# Patient Record
Sex: Male | Born: 1960 | Race: Black or African American | Hispanic: No | Marital: Married | Smoking: Never smoker
Health system: Southern US, Community
[De-identification: ages and names within clinical notes are randomized; demographics above are authoritative.]

## PROBLEM LIST (undated history)

## (undated) DIAGNOSIS — L309 Dermatitis, unspecified: Secondary | ICD-10-CM

## (undated) DIAGNOSIS — M1990 Unspecified osteoarthritis, unspecified site: Secondary | ICD-10-CM

## (undated) HISTORY — DX: Dermatitis, unspecified: L30.9

---

## 2004-08-03 ENCOUNTER — Emergency Department (HOSPITAL_COMMUNITY): Admission: EM | Admit: 2004-08-03 | Discharge: 2004-08-03 | Payer: Self-pay | Admitting: Family Medicine

## 2005-07-29 ENCOUNTER — Emergency Department (HOSPITAL_COMMUNITY): Admission: EM | Admit: 2005-07-29 | Discharge: 2005-07-29 | Payer: Self-pay | Admitting: Family Medicine

## 2005-10-06 ENCOUNTER — Emergency Department (HOSPITAL_COMMUNITY): Admission: EM | Admit: 2005-10-06 | Discharge: 2005-10-06 | Payer: Self-pay | Admitting: Family Medicine

## 2005-11-10 ENCOUNTER — Emergency Department (HOSPITAL_COMMUNITY): Admission: EM | Admit: 2005-11-10 | Discharge: 2005-11-10 | Payer: Self-pay | Admitting: Family Medicine

## 2007-06-14 ENCOUNTER — Emergency Department (HOSPITAL_COMMUNITY): Admission: EM | Admit: 2007-06-14 | Discharge: 2007-06-14 | Payer: Self-pay | Admitting: Emergency Medicine

## 2007-08-27 ENCOUNTER — Emergency Department (HOSPITAL_COMMUNITY): Admission: EM | Admit: 2007-08-27 | Discharge: 2007-08-27 | Payer: Self-pay | Admitting: Family Medicine

## 2007-12-01 ENCOUNTER — Ambulatory Visit: Payer: Self-pay | Admitting: Family Medicine

## 2007-12-18 ENCOUNTER — Emergency Department (HOSPITAL_COMMUNITY): Admission: EM | Admit: 2007-12-18 | Discharge: 2007-12-18 | Payer: Self-pay | Admitting: Emergency Medicine

## 2007-12-28 ENCOUNTER — Ambulatory Visit: Payer: Self-pay | Admitting: Family Medicine

## 2008-01-25 ENCOUNTER — Ambulatory Visit: Payer: Self-pay | Admitting: Family Medicine

## 2008-03-14 ENCOUNTER — Emergency Department (HOSPITAL_COMMUNITY): Admission: EM | Admit: 2008-03-14 | Discharge: 2008-03-14 | Payer: Self-pay | Admitting: Family Medicine

## 2008-05-01 ENCOUNTER — Emergency Department (HOSPITAL_COMMUNITY): Admission: EM | Admit: 2008-05-01 | Discharge: 2008-05-01 | Payer: Self-pay | Admitting: Family Medicine

## 2008-06-02 ENCOUNTER — Emergency Department (HOSPITAL_COMMUNITY): Admission: EM | Admit: 2008-06-02 | Discharge: 2008-06-02 | Payer: Self-pay | Admitting: Family Medicine

## 2010-10-09 ENCOUNTER — Emergency Department (HOSPITAL_COMMUNITY)
Admission: EM | Admit: 2010-10-09 | Discharge: 2010-10-09 | Payer: Self-pay | Source: Home / Self Care | Admitting: Emergency Medicine

## 2010-10-26 ENCOUNTER — Emergency Department (HOSPITAL_COMMUNITY)
Admission: EM | Admit: 2010-10-26 | Discharge: 2010-10-26 | Payer: Self-pay | Source: Home / Self Care | Admitting: Family Medicine

## 2011-03-30 ENCOUNTER — Emergency Department (HOSPITAL_COMMUNITY)
Admission: EM | Admit: 2011-03-30 | Discharge: 2011-03-30 | Disposition: A | Discharge status: Home / Self Care | Payer: Self-pay | Attending: Emergency Medicine | Admitting: Emergency Medicine

## 2011-03-30 ENCOUNTER — Emergency Department (HOSPITAL_COMMUNITY): Payer: Self-pay

## 2011-03-30 DIAGNOSIS — R079 Chest pain, unspecified: Secondary | ICD-10-CM | POA: Insufficient documentation

## 2011-03-30 DIAGNOSIS — I498 Other specified cardiac arrhythmias: Secondary | ICD-10-CM | POA: Insufficient documentation

## 2011-03-30 LAB — DIFFERENTIAL
Eosinophils Absolute: 0.5 10*3/uL (ref 0.0–0.7)
Lymphocytes Relative: 11 % — ABNORMAL LOW (ref 12–46)
Lymphs Abs: 1.5 10*3/uL (ref 0.7–4.0)
Monocytes Relative: 7 % (ref 3–12)
Neutrophils Relative %: 79 % — ABNORMAL HIGH (ref 43–77)

## 2011-03-30 LAB — CBC
HCT: 43 % (ref 39.0–52.0)
Hemoglobin: 15 g/dL (ref 13.0–17.0)
MCH: 28.7 pg (ref 26.0–34.0)
MCV: 82.2 fL (ref 78.0–100.0)
Platelets: 291 10*3/uL (ref 150–400)
RBC: 5.23 MIL/uL (ref 4.22–5.81)
WBC: 14.1 10*3/uL — ABNORMAL HIGH (ref 4.0–10.5)

## 2011-03-30 LAB — COMPREHENSIVE METABOLIC PANEL
Alkaline Phosphatase: 59 U/L (ref 39–117)
BUN: 13 mg/dL (ref 6–23)
CO2: 29 mEq/L (ref 19–32)
Chloride: 101 mEq/L (ref 96–112)
Creatinine, Ser: 1.05 mg/dL (ref 0.4–1.5)
GFR calc non Af Amer: >60 mL/min (ref >60)
Glucose, Bld: 97 mg/dL (ref 70–99)
Potassium: 4.8 mEq/L (ref 3.5–5.1)
Total Bilirubin: 0.6 mg/dL (ref 0.3–1.2)

## 2011-03-30 LAB — LIPASE, BLOOD: Lipase: 20 U/L (ref 11–59)

## 2011-03-30 LAB — TROPONIN I: Troponin I: <0.3 ng/mL (ref <0.30)

## 2011-06-15 ENCOUNTER — Inpatient Hospital Stay (INDEPENDENT_AMBULATORY_CARE_PROVIDER_SITE_OTHER)
Admission: RE | Admit: 2011-06-15 | Discharge: 2011-06-15 | Disposition: A | Discharge status: Home / Self Care | Payer: Self-pay | Source: Ambulatory Visit | Attending: Family Medicine | Admitting: Family Medicine

## 2011-06-15 DIAGNOSIS — B009 Herpesviral infection, unspecified: Secondary | ICD-10-CM

## 2011-06-26 ENCOUNTER — Inpatient Hospital Stay (INDEPENDENT_AMBULATORY_CARE_PROVIDER_SITE_OTHER)
Admission: RE | Admit: 2011-06-26 | Discharge: 2011-06-26 | Disposition: A | Discharge status: Home / Self Care | Payer: Self-pay | Source: Ambulatory Visit | Attending: Family Medicine | Admitting: Family Medicine

## 2011-06-26 DIAGNOSIS — L989 Disorder of the skin and subcutaneous tissue, unspecified: Secondary | ICD-10-CM

## 2011-07-25 LAB — HERPES SIMPLEX VIRUS CULTURE

## 2011-08-18 ENCOUNTER — Emergency Department (HOSPITAL_COMMUNITY)
Admission: EM | Admit: 2011-08-18 | Discharge: 2011-08-18 | Disposition: A | Discharge status: Home / Self Care | Payer: Self-pay | Attending: Emergency Medicine | Admitting: Emergency Medicine

## 2011-08-18 DIAGNOSIS — R221 Localized swelling, mass and lump, neck: Secondary | ICD-10-CM | POA: Insufficient documentation

## 2011-08-18 DIAGNOSIS — R21 Rash and other nonspecific skin eruption: Secondary | ICD-10-CM | POA: Insufficient documentation

## 2011-08-18 DIAGNOSIS — R220 Localized swelling, mass and lump, head: Secondary | ICD-10-CM | POA: Insufficient documentation

## 2011-09-15 ENCOUNTER — Ambulatory Visit (INDEPENDENT_AMBULATORY_CARE_PROVIDER_SITE_OTHER): Payer: Self-pay | Admitting: Family Medicine

## 2011-09-15 ENCOUNTER — Encounter: Payer: Self-pay | Admitting: Family Medicine

## 2011-09-15 VITALS — BP 110/72 | HR 60 | Wt 230.0 lb

## 2011-09-15 DIAGNOSIS — Z79899 Other long term (current) drug therapy: Secondary | ICD-10-CM

## 2011-09-15 DIAGNOSIS — L0390 Cellulitis, unspecified: Secondary | ICD-10-CM

## 2011-09-15 LAB — COMPREHENSIVE METABOLIC PANEL
ALT: 11 U/L (ref 0–53)
AST: 16 U/L (ref 0–37)
Albumin: 4.3 g/dL (ref 3.5–5.2)
BUN: 13 mg/dL (ref 6–23)
CO2: 26 mEq/L (ref 19–32)
Calcium: 9.5 mg/dL (ref 8.4–10.5)
Chloride: 99 mEq/L (ref 96–112)
Potassium: 4.8 mEq/L (ref 3.5–5.3)

## 2011-09-15 LAB — LIPID PANEL
HDL: 63 mg/dL (ref >39)
LDL Cholesterol: 81 mg/dL (ref 0–99)
Triglycerides: 48 mg/dL (ref <150)
VLDL: 10 mg/dL (ref 0–40)

## 2011-09-15 LAB — TSH: TSH: 1.557 u[IU]/mL (ref 0.350–4.500)

## 2011-09-15 LAB — CBC WITH DIFFERENTIAL/PLATELET
Eosinophils Absolute: 0.2 10*3/uL (ref 0.0–0.7)
Eosinophils Relative: 3 % (ref 0–5)
Hemoglobin: 14.1 g/dL (ref 13.0–17.0)
Lymphocytes Relative: 22 % (ref 12–46)
Lymphs Abs: 1.8 10*3/uL (ref 0.7–4.0)
MCH: 27.8 pg (ref 26.0–34.0)
MCV: 83.6 fL (ref 78.0–100.0)
Monocytes Relative: 9 % (ref 3–12)
Platelets: 390 10*3/uL (ref 150–400)
RBC: 5.07 MIL/uL (ref 4.22–5.81)
WBC: 8.4 10*3/uL (ref 4.0–10.5)

## 2011-09-15 MED ORDER — DOXYCYCLINE HYCLATE 100 MG PO TABS: 100.0000 mg | ORAL_TABLET | Freq: Two times a day (BID) | ORAL | Status: DC

## 2011-09-15 NOTE — Patient Instructions (Signed)
Take all the antibiotic and if not totally back to normal ,call me. 

## 2011-09-15 NOTE — Progress Notes (Signed)
  Subjective:    Patient ID: Brandon Hawkins, male    DOB: 12-Sep-1961, 50 y.o.   MRN: [id number]  HPI He is here for evaluation of lip swelling as well as lesions present in the inguinal area and on his torso. He was seen in an emergency room and given doxycycline as well as Keflex for this. He did say that this helped. He is in between jobs and does not have insurance at the present time. He's had no fever, chills, nausea, weight change. He is concerned about diabetes.   Review of Systems     Objective:   Physical Exam Exam of his face does show erythema swelling and vesicular type lesions on the lip. Exam of the inguinal area also shows some follicular type lesions present in the pubic hair.       Assessment & Plan:  Cellulitis and folliculitis, etiology unclear Will place him on doxycycline. Instructed him on proper care of this. I will draw blood work to ensure no diabetes etc.

## 2011-09-16 LAB — RPR

## 2011-09-30 ENCOUNTER — Telehealth: Payer: Self-pay | Admitting: Family Medicine

## 2011-09-30 ENCOUNTER — Encounter: Payer: Self-pay | Admitting: Family Medicine

## 2011-09-30 MED ORDER — DOXYCYCLINE HYCLATE 100 MG PO TABS: 100.0000 mg | ORAL_TABLET | Freq: Two times a day (BID) | ORAL | Status: AC

## 2011-09-30 NOTE — Telephone Encounter (Signed)
Doxycycline reordered.

## 2011-10-17 ENCOUNTER — Telehealth: Payer: Self-pay | Admitting: Family Medicine

## 2011-10-17 MED ORDER — DOXYCYCLINE HYCLATE 100 MG PO TABS: 100.0000 mg | ORAL_TABLET | Freq: Two times a day (BID) | ORAL | Status: AC

## 2011-10-17 NOTE — Telephone Encounter (Signed)
Doxycycline called in for continued difficulty with infection

## 2011-11-05 ENCOUNTER — Telehealth: Payer: Self-pay | Admitting: Internal Medicine

## 2011-11-05 NOTE — Telephone Encounter (Signed)
Pt has appt fri

## 2011-11-05 NOTE — Telephone Encounter (Signed)
Setup for an appointment prior to any more antibiotics

## 2011-11-07 ENCOUNTER — Ambulatory Visit: Payer: Self-pay | Admitting: Family Medicine

## 2011-11-14 ENCOUNTER — Ambulatory Visit: Payer: Self-pay | Admitting: Family Medicine

## 2011-11-19 ENCOUNTER — Encounter: Payer: Self-pay | Admitting: Family Medicine

## 2011-11-19 ENCOUNTER — Ambulatory Visit (INDEPENDENT_AMBULATORY_CARE_PROVIDER_SITE_OTHER): Payer: Self-pay | Admitting: Family Medicine

## 2011-11-19 VITALS — BP 124/80 | HR 54 | Ht 74.0 in | Wt 235.0 lb

## 2011-11-19 DIAGNOSIS — L0390 Cellulitis, unspecified: Secondary | ICD-10-CM

## 2011-11-19 DIAGNOSIS — L0291 Cutaneous abscess, unspecified: Secondary | ICD-10-CM

## 2011-11-19 LAB — CBC WITH DIFFERENTIAL/PLATELET
Basophils Absolute: 0 10*3/uL (ref 0.0–0.1)
Basophils Relative: 0 % (ref 0–1)
MCHC: 33.6 g/dL (ref 30.0–36.0)
Monocytes Absolute: 0.8 10*3/uL (ref 0.1–1.0)
Neutro Abs: 5.7 10*3/uL (ref 1.7–7.7)
Neutrophils Relative %: 65 % (ref 43–77)
Platelets: 356 10*3/uL (ref 150–400)
RDW: 13.8 % (ref 11.5–15.5)
WBC: 8.8 10*3/uL (ref 4.0–10.5)

## 2011-11-19 LAB — COMPREHENSIVE METABOLIC PANEL
ALT: 14 U/L (ref 0–53)
AST: 17 U/L (ref 0–37)
Albumin: 4.3 g/dL (ref 3.5–5.2)
Alkaline Phosphatase: 54 U/L (ref 39–117)
BUN: 14 mg/dL (ref 6–23)
Potassium: 4.4 mEq/L (ref 3.5–5.3)
Sodium: 137 mEq/L (ref 135–145)

## 2011-11-19 MED ORDER — SULFAMETHOXAZOLE-TRIMETHOPRIM 800-160 MG PO TABS: 1.0000 | ORAL_TABLET | Freq: Two times a day (BID) | ORAL | Status: AC

## 2011-11-19 NOTE — Progress Notes (Signed)
  Subjective:    Patient ID: Brandon Hawkins, male    DOB: [birth year], 51 y.o.   MRN: [id number]  HPI He is here for consultation concerning continued difficulty with an infection on his face. He has been on several rounds of doxycycline and still having difficulty. It is apparently also spread to the chin and submandibular area.   Review of Systems     Objective:   Physical Exam Exam of his face does show erythema as well as pustular lesions present on his chin and submandibular area. He does have a beard present.       Assessment & Plan:  Unresolved cellulitis Cultures were taken. He will be switched to Septra.

## 2011-11-23 LAB — WOUND CULTURE: Gram Stain: NONE SEEN

## 2012-03-05 ENCOUNTER — Telehealth: Payer: Self-pay | Admitting: Family Medicine

## 2012-03-05 NOTE — Telephone Encounter (Signed)
Pt doesn't have ins and money to come in that is why he called and asked please advise

## 2012-03-05 NOTE — Telephone Encounter (Signed)
Pt made appt for tuesday

## 2012-03-05 NOTE — Telephone Encounter (Signed)
I will work with them in terms of the money and keep the cost low but I'm not comfortable calling a medication without seeing him

## 2012-03-05 NOTE — Telephone Encounter (Signed)
It looks like the last time I saw him was January. He needs to set up an appointment

## 2012-03-09 ENCOUNTER — Ambulatory Visit: Payer: Self-pay | Admitting: Family Medicine

## 2012-03-17 ENCOUNTER — Ambulatory Visit: Payer: Self-pay | Admitting: Family Medicine

## 2012-03-18 ENCOUNTER — Encounter: Payer: Self-pay | Admitting: Family Medicine

## 2012-03-18 ENCOUNTER — Ambulatory Visit (INDEPENDENT_AMBULATORY_CARE_PROVIDER_SITE_OTHER): Payer: Self-pay | Admitting: Family Medicine

## 2012-03-18 VITALS — BP 132/80 | HR 62 | Temp 98.2°F | Wt 230.0 lb

## 2012-03-18 DIAGNOSIS — L738 Other specified follicular disorders: Secondary | ICD-10-CM

## 2012-03-18 DIAGNOSIS — B350 Tinea barbae and tinea capitis: Secondary | ICD-10-CM

## 2012-03-18 MED ORDER — DOXYCYCLINE HYCLATE 100 MG PO TABS: 100.0000 mg | ORAL_TABLET | Freq: Two times a day (BID) | ORAL | Status: AC

## 2012-03-18 NOTE — Progress Notes (Signed)
  Subjective:    Patient ID: Brandon Hawkins, male    DOB: Mar 14, 1961, 51 y.o.   MRN: [id number]  HPI He is here for evaluation of recurrent difficulty with infection of the chin area. He has a previous history of difficulty with cellulitis of this area. This especially occurs when he tries to shave.   Review of Systems     Objective:   Physical Exam Brandon Hawkins is noted. He does have multiple maculopapular lesions present on the chin.       Assessment & Plan:   1. Folliculitis barbae  doxycycline (VIBRA-TABS) 100 MG tablet   he is to continue on the Vibra-Tabs for at least 3 weeks. He will call me at that time. Encouraged him to grow a beard and to avoid shaving to help prevent this in the future.

## 2012-04-19 ENCOUNTER — Telehealth: Payer: Self-pay | Admitting: Family Medicine

## 2012-04-19 MED ORDER — DOXYCYCLINE HYCLATE 100 MG PO TABS: 100.0000 mg | ORAL_TABLET | Freq: Two times a day (BID) | ORAL | Status: AC

## 2012-04-19 NOTE — Telephone Encounter (Signed)
Doxycycline called in for treatment of folliculitis barbae

## 2012-05-24 ENCOUNTER — Telehealth: Payer: Self-pay | Admitting: Medical

## 2012-05-24 MED ORDER — DOXYCYCLINE HYCLATE 100 MG PO TABS: 100.0000 mg | ORAL_TABLET | Freq: Two times a day (BID) | ORAL | Status: AC

## 2012-05-24 NOTE — Telephone Encounter (Signed)
Doxycycline called in

## 2012-06-08 ENCOUNTER — Telehealth: Payer: Self-pay | Admitting: Medical

## 2012-06-08 NOTE — Telephone Encounter (Signed)
Called pt to inform him Dr.Lalonde wants pt to have follow up appt

## 2012-06-08 NOTE — Telephone Encounter (Signed)
Pt has appt 8/8

## 2012-06-08 NOTE — Telephone Encounter (Signed)
He needs a follow-up appointment

## 2012-06-08 NOTE — Telephone Encounter (Signed)
Is this ok?

## 2012-06-08 NOTE — Telephone Encounter (Signed)
This should go to Dr. Susann Givens.   I haven't seen the patient.

## 2012-06-10 ENCOUNTER — Ambulatory Visit (INDEPENDENT_AMBULATORY_CARE_PROVIDER_SITE_OTHER): Payer: Self-pay | Admitting: Family Medicine

## 2012-06-10 VITALS — BP 120/70 | HR 60 | Wt 228.0 lb

## 2012-06-10 DIAGNOSIS — L738 Other specified follicular disorders: Secondary | ICD-10-CM

## 2012-06-10 DIAGNOSIS — L739 Follicular disorder, unspecified: Secondary | ICD-10-CM

## 2012-06-10 MED ORDER — SULFAMETHOXAZOLE-TRIMETHOPRIM 800-160 MG PO TABS: 1.0000 | ORAL_TABLET | Freq: Two times a day (BID) | ORAL | Status: AC

## 2012-06-10 NOTE — Progress Notes (Signed)
  Subjective:    Patient ID: Brandon Hawkins, male    DOB: [birth year], 51 y.o.   MRN: [id number]  HPI He is here for recheck. He does state that the doxycycline did help however when he would stop taking it, the swelling and redness on the upper lip would recur. 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 I kept him on it since he clinically was responding.   Review of Systems     Objective:   Physical Exam Exam of his upper lip does show erythema and swelling with some crusting. No other lesions are noted.      Assessment & Plan:   1. Folliculitis  sulfamethoxazole-trimethoprim (BACTRIM DS,SEPTRA DS) 800-160 MG per tablet   I will switch him to Septra which the cultures it should be a better choice. He is to call me in 2 weeks.

## 2012-11-26 ENCOUNTER — Ambulatory Visit (INDEPENDENT_AMBULATORY_CARE_PROVIDER_SITE_OTHER): Payer: Self-pay | Admitting: Medical

## 2012-11-26 ENCOUNTER — Encounter: Payer: Self-pay | Admitting: Medical

## 2012-11-26 VITALS — BP 130/90 | HR 58 | Temp 97.9°F | Resp 16 | Wt 237.0 lb

## 2012-11-26 DIAGNOSIS — L0100 Impetigo, unspecified: Secondary | ICD-10-CM

## 2012-11-26 DIAGNOSIS — L738 Other specified follicular disorders: Secondary | ICD-10-CM

## 2012-11-26 DIAGNOSIS — L678 Other hair color and hair shaft abnormalities: Secondary | ICD-10-CM

## 2012-11-26 DIAGNOSIS — L739 Follicular disorder, unspecified: Secondary | ICD-10-CM

## 2012-11-26 MED ORDER — SULFAMETHOXAZOLE-TRIMETHOPRIM 800-160 MG PO TABS: 1.0000 | ORAL_TABLET | Freq: Two times a day (BID) | ORAL | Status: DC

## 2012-11-26 MED ORDER — MUPIROCIN 2 % EX OINT: TOPICAL_OINTMENT | Freq: Three times a day (TID) | CUTANEOUS | Status: DC

## 2012-11-26 NOTE — Progress Notes (Signed)
Subjective: Here for skin rash and crusting of upper lip.  Has hx/o eczema.  Gets this lip inflammation from time to time.  Was seen here August for the same.  Has been seen here several times prior for same.   Sometimes gets similar rash on pubic area and in scalp.   ROS  No fever, chills No lymph node swelling GU negative GI negative  Objective: Gen: wd, wn, nad Skin: skin above upper lip with some pustular lesions, some serous crusted lesions, overall the skin above the upper lip is inflamed and with erythema throughout.  Lips normal.  Abdomen has rough skin mildly suggestive of eczema.  Otherwise skin unremarkable   Assessment: Encounter Diagnoses  Name Primary?  . Folliculitis Yes  . 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   Begin another course of Bactrim and mupirocin ointment topically.   Gave refill in the event of recurrence.  Regarding his eczema, use daily moisturizing lotion, particularly abdomen.  F/u if not improving.

## 2012-11-26 NOTE — Patient Instructions (Signed)
Consider trying [facility] Shampoo in the beard/scalp area once to twice weekly. Massage this in, leave on for 10 minutes then wash off.  In general, moisturize your body daily with Lubriderm or similar lotion.  I am sending an ointment and oral antibiotic to your pharmacy.  Let us know if this isn't resolving the skin issue.

## 2013-11-09 ENCOUNTER — Telehealth: Payer: Self-pay | Admitting: Medical

## 2013-11-09 NOTE — Telephone Encounter (Signed)
Pt called wanting a refill on cream rx. Pt was informed he would need a rx due to the fact its been a year since he was seen. Pt was offered and appt and stated he would call back.

## 2017-07-09 NOTE — Congregational Nurse Program (Signed)
Congregational Nurse Program Note  Date of Encounter: 06/03/2017  Past Medical History: Past Medical History:  Diagnosis Date  . Eczema     Encounter Details:   Needed assistance and information about applying for the orange card.  Gave client the application and instructions

## 2017-10-14 DIAGNOSIS — M7582 Other shoulder lesions, left shoulder: Secondary | ICD-10-CM | POA: Diagnosis not present

## 2017-10-14 DIAGNOSIS — M5412 Radiculopathy, cervical region: Secondary | ICD-10-CM | POA: Diagnosis not present

## 2017-10-14 DIAGNOSIS — M170 Bilateral primary osteoarthritis of knee: Secondary | ICD-10-CM | POA: Diagnosis not present

## 2017-11-17 DIAGNOSIS — M542 Cervicalgia: Secondary | ICD-10-CM | POA: Diagnosis not present

## 2017-11-17 DIAGNOSIS — M25512 Pain in left shoulder: Secondary | ICD-10-CM | POA: Diagnosis not present

## 2017-11-17 DIAGNOSIS — M67912 Unspecified disorder of synovium and tendon, left shoulder: Secondary | ICD-10-CM | POA: Diagnosis not present

## 2017-11-17 DIAGNOSIS — M25562 Pain in left knee: Secondary | ICD-10-CM | POA: Diagnosis not present

## 2017-11-17 DIAGNOSIS — M25561 Pain in right knee: Secondary | ICD-10-CM | POA: Diagnosis not present

## 2017-11-26 DIAGNOSIS — M1711 Unilateral primary osteoarthritis, right knee: Secondary | ICD-10-CM | POA: Diagnosis not present

## 2017-11-26 DIAGNOSIS — M25661 Stiffness of right knee, not elsewhere classified: Secondary | ICD-10-CM | POA: Diagnosis not present

## 2017-11-26 DIAGNOSIS — M1712 Unilateral primary osteoarthritis, left knee: Secondary | ICD-10-CM | POA: Diagnosis not present

## 2017-12-17 DIAGNOSIS — M25661 Stiffness of right knee, not elsewhere classified: Secondary | ICD-10-CM | POA: Diagnosis not present

## 2017-12-17 DIAGNOSIS — M1712 Unilateral primary osteoarthritis, left knee: Secondary | ICD-10-CM | POA: Diagnosis not present

## 2017-12-17 DIAGNOSIS — M1711 Unilateral primary osteoarthritis, right knee: Secondary | ICD-10-CM | POA: Diagnosis not present

## 2018-06-24 DIAGNOSIS — M1712 Unilateral primary osteoarthritis, left knee: Secondary | ICD-10-CM | POA: Diagnosis not present

## 2018-06-24 DIAGNOSIS — M25461 Effusion, right knee: Secondary | ICD-10-CM | POA: Diagnosis not present

## 2018-06-24 DIAGNOSIS — M1711 Unilateral primary osteoarthritis, right knee: Secondary | ICD-10-CM | POA: Diagnosis not present

## 2018-06-24 DIAGNOSIS — M25462 Effusion, left knee: Secondary | ICD-10-CM | POA: Diagnosis not present

## 2018-07-26 DIAGNOSIS — L03012 Cellulitis of left finger: Secondary | ICD-10-CM | POA: Diagnosis not present

## 2018-07-26 DIAGNOSIS — S6992XA Unspecified injury of left wrist, hand and finger(s), initial encounter: Secondary | ICD-10-CM | POA: Diagnosis not present

## 2018-08-11 DIAGNOSIS — L03012 Cellulitis of left finger: Secondary | ICD-10-CM | POA: Diagnosis not present

## 2018-09-02 DIAGNOSIS — M79601 Pain in right arm: Secondary | ICD-10-CM | POA: Diagnosis not present

## 2018-09-02 DIAGNOSIS — M25521 Pain in right elbow: Secondary | ICD-10-CM | POA: Diagnosis not present

## 2018-11-03 HISTORY — PX: REPLACEMENT TOTAL KNEE: SUR1224

## 2018-12-27 DIAGNOSIS — M1711 Unilateral primary osteoarthritis, right knee: Secondary | ICD-10-CM | POA: Diagnosis not present

## 2018-12-27 DIAGNOSIS — M1712 Unilateral primary osteoarthritis, left knee: Secondary | ICD-10-CM | POA: Diagnosis not present

## 2019-01-04 DIAGNOSIS — M25562 Pain in left knee: Secondary | ICD-10-CM | POA: Diagnosis not present

## 2019-01-06 DIAGNOSIS — M25562 Pain in left knee: Secondary | ICD-10-CM | POA: Diagnosis not present

## 2019-01-06 DIAGNOSIS — M25561 Pain in right knee: Secondary | ICD-10-CM | POA: Diagnosis not present

## 2019-01-25 DIAGNOSIS — Z01810 Encounter for preprocedural cardiovascular examination: Secondary | ICD-10-CM | POA: Diagnosis not present

## 2019-01-25 DIAGNOSIS — Z01818 Encounter for other preprocedural examination: Secondary | ICD-10-CM | POA: Diagnosis not present

## 2019-03-14 DIAGNOSIS — Z01810 Encounter for preprocedural cardiovascular examination: Secondary | ICD-10-CM | POA: Diagnosis not present

## 2019-03-21 ENCOUNTER — Emergency Department (HOSPITAL_COMMUNITY): Payer: BLUE CROSS/BLUE SHIELD

## 2019-03-21 ENCOUNTER — Other Ambulatory Visit: Payer: Self-pay

## 2019-03-21 ENCOUNTER — Encounter (HOSPITAL_COMMUNITY): Payer: Self-pay | Admitting: *Deleted

## 2019-03-21 ENCOUNTER — Emergency Department (HOSPITAL_COMMUNITY)
Admission: EM | Admit: 2019-03-21 | Discharge: 2019-03-22 | Disposition: A | Discharge status: Home / Self Care | Payer: BLUE CROSS/BLUE SHIELD | Attending: Emergency Medicine | Admitting: Emergency Medicine

## 2019-03-21 DIAGNOSIS — R079 Chest pain, unspecified: Secondary | ICD-10-CM

## 2019-03-21 DIAGNOSIS — R0789 Other chest pain: Secondary | ICD-10-CM | POA: Diagnosis not present

## 2019-03-21 DIAGNOSIS — R9431 Abnormal electrocardiogram [ECG] [EKG]: Secondary | ICD-10-CM | POA: Diagnosis not present

## 2019-03-21 DIAGNOSIS — R0602 Shortness of breath: Secondary | ICD-10-CM | POA: Diagnosis not present

## 2019-03-21 LAB — TROPONIN I
Troponin I: <0.03 ng/mL (ref <0.03)
Troponin I: <0.03 ng/mL (ref <0.03)

## 2019-03-21 LAB — BASIC METABOLIC PANEL
Anion gap: 12 (ref 5–15)
BUN: 13 mg/dL (ref 6–20)
CO2: 25 mmol/L (ref 22–32)
Calcium: 9.4 mg/dL (ref 8.9–10.3)
Chloride: 102 mmol/L (ref 98–111)
Creatinine, Ser: 1.29 mg/dL — ABNORMAL HIGH (ref 0.61–1.24)
GFR calc Af Amer: >60 mL/min (ref >60)
GFR calc non Af Amer: >60 mL/min (ref >60)
Glucose, Bld: 122 mg/dL — ABNORMAL HIGH (ref 70–99)
Potassium: 3.9 mmol/L (ref 3.5–5.1)
Sodium: 139 mmol/L (ref 135–145)

## 2019-03-21 LAB — CBC
HCT: 41.4 % (ref 39.0–52.0)
Hemoglobin: 13.6 g/dL (ref 13.0–17.0)
MCH: 27.1 pg (ref 26.0–34.0)
MCHC: 32.9 g/dL (ref 30.0–36.0)
MCV: 82.6 fL (ref 80.0–100.0)
Platelets: 316 10*3/uL (ref 150–400)
RBC: 5.01 MIL/uL (ref 4.22–5.81)
RDW: 13.8 % (ref 11.5–15.5)
WBC: 12.2 10*3/uL — ABNORMAL HIGH (ref 4.0–10.5)
nRBC: 0 % (ref 0.0–0.2)

## 2019-03-21 MED ORDER — SODIUM CHLORIDE 0.9% FLUSH: 3.0000 mL | Freq: Once | INTRAVENOUS | Status: DC

## 2019-03-21 NOTE — ED Triage Notes (Signed)
Pt was sitting watching a Western on TV and developed left sided chest pain that started radiating up to mid chest and stated pressure.  Radiation to the back.  Pt was given 324mg  ASA and 2 sl nitro.  Pt pain has improved and sob better. Pt is alert and talking.  Scheduled for right knee surgery next week.

## 2019-03-21 NOTE — ED Provider Notes (Signed)
[facility] EMERGENCY DEPARTMENT Provider Note   CSN: [id number] Arrival date & time: 03/21/19  1548    History   Chief Complaint Chief Complaint  Patient presents with  . Chest Pain    HPI Brandon Hawkins is a 58 y.o. male who presents with chest pain. PMH significant for arthritis.  Patient states that earlier today he ate breakfast and around 1130 when he was sitting on the couch she developed some left-sided chest pain.  He states it was a sharp pain and he had to stand up because it was so uncomfortable.  This caused the pain to ease off some and later that day when he ate lunch he started to have worsening pain again.  He denies any associated symptoms.  No fever, cough, diaphoresis, nausea, shortness of breath, leg swelling.  He has had a similar pain approximately 10 years ago but denies any prior cardiac work-up.  He does not smoke or use any drugs.  He denies any significant family history of cardiac disease.  He does not take any medicines.  EMS was called and he was given aspirin and nitroglycerin and this resolved his symptoms.     HPI  Past Medical History:  Diagnosis Date  . Eczema     There are no active problems to display for this patient.   History reviewed. No pertinent surgical history.      Home Medications    Prior to Admission medications   Medication Sig Start Date End Date Taking? Authorizing Provider  mupirocin ointment (BACTROBAN) 2 % Apply topically 3 (three) times daily. 11/26/12   Tysinger, Kermit Balo, PA-C  sulfamethoxazole-trimethoprim (BACTRIM DS,SEPTRA DS) 800-160 MG per tablet Take 1 tablet by mouth 2 (two) times daily. 11/26/12   Tysinger, Kermit Balo, PA-C    Family History No family history on file.  Social History Social History   Tobacco Use  . Smoking status: Never Smoker  . Smokeless tobacco: Never Used  Substance Use Topics  . Alcohol use: Yes  . Drug use: No     Allergies   Patient has no known allergies.    Review of Systems Review of Systems  Constitutional: Negative for fever.  Respiratory: Negative for cough, shortness of breath and wheezing.   Cardiovascular: Positive for chest pain. Negative for palpitations and leg swelling.  Gastrointestinal: Negative for abdominal pain, diarrhea, nausea and vomiting.  Genitourinary: Negative for dysuria.  All other systems reviewed and are negative.    Physical Exam Updated Vital Signs BP (!) 142/79 (BP Location: Right Arm)   Pulse 64   Temp 98.7 F (37.1 C) (Oral)   Resp 16   SpO2 100%   Physical Exam Vitals signs and nursing note reviewed.  Constitutional:      General: He is not in acute distress.    Appearance: Normal appearance. He is well-developed. He is obese. He is not ill-appearing.     Comments: Calm, cooperative, pleasant  HENT:     Head: Normocephalic and atraumatic.  Eyes:     General: No scleral icterus.       Right eye: No discharge.        Left eye: No discharge.     Conjunctiva/sclera: Conjunctivae normal.     Pupils: Pupils are equal, round, and reactive to light.  Neck:     Musculoskeletal: Normal range of motion.  Cardiovascular:     Rate and Rhythm: Normal rate and regular rhythm.  Pulmonary:  Effort: Pulmonary effort is normal. No respiratory distress.     Breath sounds: Normal breath sounds.  Abdominal:     General: There is no distension.     Palpations: Abdomen is soft.     Tenderness: There is no abdominal tenderness.  Skin:    General: Skin is warm and dry.  Neurological:     Mental Status: He is alert and oriented to person, place, and time.  Psychiatric:        Behavior: Behavior normal.      ED Treatments / Results  Labs (all labs ordered are listed, but only abnormal results are displayed) Labs Reviewed  BASIC METABOLIC PANEL - Abnormal; Notable for the following components:      Result Value   Glucose, Bld 122 (*)    Creatinine, Ser 1.29 (*)    All other components within  normal limits  CBC - Abnormal; Notable for the following components:   WBC 12.2 (*)    All other components within normal limits  TROPONIN I  TROPONIN I    EKG EKG Interpretation  Date/Time:  Monday Mar 21 2019 15:53:03 EDT Ventricular Rate:  86 PR Interval:  176 QRS Duration: 82 QT Interval:  366 QTC Calculation: 437 R Axis:   78 Text Interpretation:  Normal sinus rhythm ST & T wave abnormality, consider anterior ischemia Abnormal ECG When compared to prior, new t wave inversion in V4.  No STEMI Confirmed by Theda Belfastegeler, Chris ([id number]) on 03/21/2019 10:31:35 PM   Radiology Dg Chest 2 View  Result Date: 03/21/2019 CLINICAL DATA:  Chest pain and shortness of breath EXAM: CHEST - 2 VIEW COMPARISON:  Mar 30, 2011 FINDINGS: No edema or consolidation. Heart size and pulmonary vascularity are normal. No adenopathy. No pneumothorax. No bone lesions. IMPRESSION: No edema or consolidation. Electronically Signed   By: Bretta BangWilliam  Woodruff III M.D.   On: 03/21/2019 16:17    Procedures Procedures (including critical care time)  Medications Ordered in ED Medications  sodium chloride flush (NS) 0.9 % injection 3 mL (has no administration in time range)     Initial Impression / Assessment and Plan / ED Course  I have reviewed the triage vital signs and the nursing notes.  Pertinent labs & imaging results that were available during my care of the patient were reviewed by me and considered in my medical decision making (see chart for details).  58 year old with chest pain after eating that resolved with nitro earlier today. Chest pain work up is reassuring. EKG is NSR. CXR is negative. Initial and second troponin is 0. Labs are unremarkable. No significant past or family hx of cardiac disease. Patient is non-smoker. HEART score is 3. Pain has typical and atypical features for ACS. Suspect pain may have been related to food intake although he does have some risk factors and pain improved with nitro. He  would rather follow up with his doctor than come in for chest pain obs. He was advised to f/u with his doctor and strongly advised to see a cardiologist. He verbalized understanding.  Final Clinical Impressions(s) / ED Diagnoses   Final diagnoses:  Nonspecific chest pain    ED Discharge Orders    None       Bethel BornGekas, Darling Cieslewicz Marie, PA-C 03/22/19 0011    Tegeler, Canary Brimhristopher J, MD 03/22/19 [phone]

## 2019-03-21 NOTE — Discharge Instructions (Signed)
Please follow up with your doctor  Make an appointment with cardiology Please return for any worsening symptoms

## 2019-03-22 DIAGNOSIS — M1711 Unilateral primary osteoarthritis, right knee: Secondary | ICD-10-CM | POA: Diagnosis not present

## 2019-03-22 NOTE — ED Notes (Signed)
Patient verbalizes understanding of discharge instructions. Opportunity for questioning and answers were provided. Armband removed by staff, pt discharged from ED.  

## 2019-03-23 DIAGNOSIS — M170 Bilateral primary osteoarthritis of knee: Secondary | ICD-10-CM | POA: Diagnosis not present

## 2019-03-23 DIAGNOSIS — Z96651 Presence of right artificial knee joint: Secondary | ICD-10-CM | POA: Diagnosis not present

## 2019-03-23 DIAGNOSIS — M1711 Unilateral primary osteoarthritis, right knee: Secondary | ICD-10-CM | POA: Diagnosis not present

## 2019-04-19 DIAGNOSIS — K610 Anal abscess: Secondary | ICD-10-CM | POA: Diagnosis not present

## 2019-05-17 DIAGNOSIS — M25562 Pain in left knee: Secondary | ICD-10-CM | POA: Diagnosis not present

## 2019-05-17 DIAGNOSIS — M1712 Unilateral primary osteoarthritis, left knee: Secondary | ICD-10-CM | POA: Diagnosis not present

## 2019-05-19 DIAGNOSIS — K6289 Other specified diseases of anus and rectum: Secondary | ICD-10-CM | POA: Diagnosis not present

## 2019-05-26 ENCOUNTER — Ambulatory Visit: Payer: Self-pay | Admitting: General Surgery

## 2019-05-26 DIAGNOSIS — K603 Anal fistula: Secondary | ICD-10-CM | POA: Diagnosis not present

## 2019-05-26 NOTE — H&P (Signed)
History of Present Illness Brandon Ruff MD; 1/61/0960 11:36 AM) The patient is a 58 year old male who presents with anal pain. He presented to the office last week per request of Dr. Radene Ou, at [place] at [facility], who evaluated him on 04/19/19 for perianal pain. She drained a right posterior perianal abscess and obtain a culture which grew Escherichia coli. He was given Augmentin which was switched to Levaquin. He states over the past month the area has drained intermittently. He complains of pain after bowel movements but otherwise has not had much pain over the past month. 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 Several months before this, he had one episode of perianal drainage and an area of pain which resolved spontaneously. He states he has never had a colonoscopy. He does not believe he has diabetes. He does not smoke. He saw [place] in the office last week and was started on Augmentin for some continued purulent drainage. He states that his symptoms are much better now.   Problem List/Past Medical Brandon Ruff, MD; 4/54/0981 11:38 AM) ANAL FISTULA (K60.3)  Past Surgical History Brandon Ruff, MD; 1/91/4782 11:38 AM) Knee Surgery Right.  Allergies Mammie Lorenzo, LPN; 9/56/2130 86:57 AM) No Known Allergies [05/19/2019]: No Known Drug Allergies [05/19/2019]:  Medication History Brandon Ruff, MD; 8/46/9629 11:38 AM) oxyCODONE-Acetaminophen (2.5-325MG  Tablet, Oral) Active.  Social History Brandon Ruff, MD; 03/31/4131 11:38 AM) Alcohol use Remotely quit alcohol use. Caffeine use Coffee. Illicit drug use Remotely quit drug use. Tobacco use Former smoker.  Family History Brandon Ruff, MD; 4/40/1027 11:38 AM) Arthritis Brother, Mother, Sister. Diabetes Mellitus Brother.  Other Problems Brandon Ruff, MD; 2/53/6644 11:38 AM) Arthritis Back Pain     Review of Systems Brandon Ruff MD; 0/34/7425 11:38 AM) General Not Present-  Appetite Loss, Chills, Fatigue, Fever, Night Sweats, Weight Gain and Weight Loss. Skin Not Present- Change in Wart/Mole, Dryness, Hives, Jaundice, New Lesions, Non-Healing Wounds, Rash and Ulcer. HEENT Not Present- Earache, Hearing Loss, Hoarseness, Nose Bleed, Oral Ulcers, Ringing in the Ears, Seasonal Allergies, Sinus Pain, Sore Throat, Visual Disturbances, Wears glasses/contact lenses and Yellow Eyes. Respiratory Not Present- Bloody sputum, Chronic Cough, Difficulty Breathing, Snoring and Wheezing. Breast Not Present- Breast Mass, Breast Pain, Nipple Discharge and Skin Changes. Cardiovascular Not Present- Chest Pain, Difficulty Breathing Lying Down, Leg Cramps, Palpitations, Rapid Heart Rate, Shortness of Breath and Swelling of Extremities. Gastrointestinal Not Present- Abdominal Pain, Bloating, Bloody Stool, Change in Bowel Habits, Chronic diarrhea, Constipation, Difficulty Swallowing, Excessive gas, Gets full quickly at meals, Hemorrhoids, Indigestion, Nausea, Rectal Pain and Vomiting. Male Genitourinary Not Present- Blood in Urine, Change in Urinary Stream, Frequency, Impotence, Nocturia, Painful Urination, Urgency and Urine Leakage. Musculoskeletal Not Present- Back Pain, Joint Pain, Joint Stiffness, Muscle Pain, Muscle Weakness and Swelling of Extremities. Neurological Not Present- Decreased Memory, Fainting, Headaches, Numbness, Seizures, Tingling, Tremor, Trouble walking and Weakness. Psychiatric Not Present- Anxiety, Bipolar, Change in Sleep Pattern, Depression, Fearful and Frequent crying. Endocrine Not Present- Cold Intolerance, Excessive Hunger, Hair Changes, Heat Intolerance and New Diabetes. Hematology Not Present- Blood Thinners, Easy Bruising, Excessive bleeding, Gland problems, HIV and Persistent Infections.  Vitals Claiborne Billings Dockery LPN; 9/56/3875 64:33 AM) 05/26/2019 11:16 AM Weight: 266.8 lb Height: 74in Body Surface Area: 2.46 m Body Mass Index: 34.25 kg/m  Temp.:  98.66F(Temporal)  Pulse: 92 (Regular)  BP: 146/80 (Sitting, Left Arm, Standard)        Physical Exam Brandon Ruff MD; 2/95/1884 11:39 AM)  The physical exam findings are  as follows: Note:GENERAL: Well-developed, well nourished male in no acute distress  EYES: No scleral icterus Pupils equal, lids normal  EXTERNAL EARS: Intact, no masses or lesions EXTERNAL NOSE: Intact, no masses or lesions MOUTH: Lips - no lesions Dentition - normal for age  RESPIRATORY: Normal effort, no use of accessory muscles  MUSCULOSKELETAL: Normal gait Grossly normal ROM upper extremities Grossly normal ROM lower extremities  SKIN: Warm and dry Not diaphoretic  PSYCHIATRIC: Normal judgement and insight Normal mood and affect Alert, oriented x 3  Rectal Note: Right posterior perianal region: There is a 5 mm external opening on the skin in the right posteriolateral anal region. When pressing on the surrounding tissues a small amount of purulent drainage releases. There is no surrounding erythema, induration, fluctuance, or swelling. DRE without obvious masses. During exam, a larger amount of purulence was expelled from external opening.    Assessment & Plan (Kyomi Hector MD; 05/26/2019 11:38 AM)  ANAL FISTULA (K60.3) Impression: 58-year-old male status post drainage of perirectal abscess approximately 6 weeks ago, who has persistent purulent drainage. 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 I have recommended anal exam under anesthesia with fistulotomy versus seton placement. We discussed that his fistula does seem to be superficial and this is promising for performing a fistulotomy. We discussed the risk of incontinence with fistulotomy as well as the need for additional surgery if seton is needed. 

## 2019-05-26 NOTE — H&P (View-Only) (Signed)
History of Present Illness Brandon Ruff MD; 1/61/0960 11:36 AM) The patient is a 58 year old male who presents with anal pain. He presented to the office last week per request of Dr. Radene Ou, at [place] at [facility], who evaluated him on 04/19/19 for perianal pain. She drained a right posterior perianal abscess and obtain a culture which grew Escherichia coli. He was given Augmentin which was switched to Levaquin. He states over the past month the area has drained intermittently. He complains of pain after bowel movements but otherwise has not had much pain over the past month. 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 Several months before this, he had one episode of perianal drainage and an area of pain which resolved spontaneously. He states he has never had a colonoscopy. He does not believe he has diabetes. He does not smoke. He saw [place] in the office last week and was started on Augmentin for some continued purulent drainage. He states that his symptoms are much better now.   Problem List/Past Medical Brandon Ruff, MD; 4/54/0981 11:38 AM) ANAL FISTULA (K60.3)  Past Surgical History Brandon Ruff, MD; 1/91/4782 11:38 AM) Knee Surgery Right.  Allergies Brandon Lorenzo, LPN; 9/56/2130 86:57 AM) No Known Allergies [05/19/2019]: No Known Drug Allergies [05/19/2019]:  Medication History Brandon Ruff, MD; 8/46/9629 11:38 AM) oxyCODONE-Acetaminophen (2.5-325MG  Tablet, Oral) Active.  Social History Brandon Ruff, MD; 03/31/4131 11:38 AM) Alcohol use Remotely quit alcohol use. Caffeine use Coffee. Illicit drug use Remotely quit drug use. Tobacco use Former smoker.  Family History Brandon Ruff, MD; 4/40/1027 11:38 AM) Arthritis Brother, Mother, Sister. Diabetes Mellitus Brother.  Other Problems Brandon Ruff, MD; 2/53/6644 11:38 AM) Arthritis Back Pain     Review of Systems Brandon Ruff MD; 0/34/7425 11:38 AM) General Not Present-  Appetite Loss, Chills, Fatigue, Fever, Night Sweats, Weight Gain and Weight Loss. Skin Not Present- Change in Wart/Mole, Dryness, Hives, Jaundice, New Lesions, Non-Healing Wounds, Rash and Ulcer. HEENT Not Present- Earache, Hearing Loss, Hoarseness, Nose Bleed, Oral Ulcers, Ringing in the Ears, Seasonal Allergies, Sinus Pain, Sore Throat, Visual Disturbances, Wears glasses/contact lenses and Yellow Eyes. Respiratory Not Present- Bloody sputum, Chronic Cough, Difficulty Breathing, Snoring and Wheezing. Breast Not Present- Breast Mass, Breast Pain, Nipple Discharge and Skin Changes. Cardiovascular Not Present- Chest Pain, Difficulty Breathing Lying Down, Leg Cramps, Palpitations, Rapid Heart Rate, Shortness of Breath and Swelling of Extremities. Gastrointestinal Not Present- Abdominal Pain, Bloating, Bloody Stool, Change in Bowel Habits, Chronic diarrhea, Constipation, Difficulty Swallowing, Excessive gas, Gets full quickly at meals, Hemorrhoids, Indigestion, Nausea, Rectal Pain and Vomiting. Male Genitourinary Not Present- Blood in Urine, Change in Urinary Stream, Frequency, Impotence, Nocturia, Painful Urination, Urgency and Urine Leakage. Musculoskeletal Not Present- Back Pain, Joint Pain, Joint Stiffness, Muscle Pain, Muscle Weakness and Swelling of Extremities. Neurological Not Present- Decreased Memory, Fainting, Headaches, Numbness, Seizures, Tingling, Tremor, Trouble walking and Weakness. Psychiatric Not Present- Anxiety, Bipolar, Change in Sleep Pattern, Depression, Fearful and Frequent crying. Endocrine Not Present- Cold Intolerance, Excessive Hunger, Hair Changes, Heat Intolerance and New Diabetes. Hematology Not Present- Blood Thinners, Easy Bruising, Excessive bleeding, Gland problems, HIV and Persistent Infections.  Vitals Brandon Billings Dockery LPN; 9/56/3875 64:33 AM) 05/26/2019 11:16 AM Weight: 266.8 lb Height: 74in Body Surface Area: 2.46 m Body Mass Index: 34.25 kg/m  Temp.:  98.66F(Temporal)  Pulse: 92 (Regular)  BP: 146/80 (Sitting, Left Arm, Standard)        Physical Exam Brandon Ruff MD; 2/95/1884 11:39 AM)  The physical exam findings are  as follows: Note:GENERAL: Well-developed, well nourished male in no acute distress  EYES: No scleral icterus Pupils equal, lids normal  EXTERNAL EARS: Intact, no masses or lesions EXTERNAL NOSE: Intact, no masses or lesions MOUTH: Lips - no lesions Dentition - normal for age  RESPIRATORY: Normal effort, no use of accessory muscles  MUSCULOSKELETAL: Normal gait Grossly normal ROM upper extremities Grossly normal ROM lower extremities  SKIN: Warm and dry Not diaphoretic  PSYCHIATRIC: Normal judgement and insight Normal mood and affect Alert, oriented x 3  Rectal Note: Right posterior perianal region: There is a 5 mm external opening on the skin in the right posteriolateral anal region. When pressing on the surrounding tissues a small amount of purulent drainage releases. There is no surrounding erythema, induration, fluctuance, or swelling. DRE without obvious masses. During exam, a larger amount of purulence was expelled from external opening.    Assessment & Plan Brandon Hawkins(Kseniya Grunden MD; 05/26/2019 11:38 AM)  ANAL FISTULA (K60.3) Impression: 58 year old male status post drainage of perirectal abscess approximately 6 weeks ago, who has persistent purulent drainage. 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 I have recommended anal exam under anesthesia with fistulotomy versus seton placement. We discussed that his fistula does seem to be superficial and this is promising for performing a fistulotomy. We discussed the risk of incontinence with fistulotomy as well as the need for additional surgery if seton is needed.

## 2019-06-13 ENCOUNTER — Encounter (HOSPITAL_BASED_OUTPATIENT_CLINIC_OR_DEPARTMENT_OTHER): Payer: Self-pay | Admitting: *Deleted

## 2019-06-13 ENCOUNTER — Other Ambulatory Visit (HOSPITAL_COMMUNITY)
Admission: RE | Admit: 2019-06-13 | Discharge: 2019-06-13 | Disposition: A | Discharge status: Home / Self Care | Payer: BC Managed Care – PPO | Source: Ambulatory Visit | Attending: General Surgery | Admitting: General Surgery

## 2019-06-13 ENCOUNTER — Other Ambulatory Visit: Payer: Self-pay

## 2019-06-13 DIAGNOSIS — K603 Anal fistula: Secondary | ICD-10-CM | POA: Insufficient documentation

## 2019-06-13 DIAGNOSIS — Z01812 Encounter for preprocedural laboratory examination: Secondary | ICD-10-CM | POA: Diagnosis not present

## 2019-06-13 DIAGNOSIS — Z20828 Contact with and (suspected) exposure to other viral communicable diseases: Secondary | ICD-10-CM | POA: Insufficient documentation

## 2019-06-13 LAB — SARS CORONAVIRUS 2 (TAT 6-24 HRS): SARS Coronavirus 2: NEGATIVE

## 2019-06-13 NOTE — Progress Notes (Signed)
Spoke with patient via telephone for pre op interview. NPO after MN. No medications AM of surgery. Arrival time 0930. 

## 2019-06-16 ENCOUNTER — Other Ambulatory Visit: Payer: Self-pay

## 2019-06-16 ENCOUNTER — Ambulatory Visit (HOSPITAL_BASED_OUTPATIENT_CLINIC_OR_DEPARTMENT_OTHER)
Admission: RE | Admit: 2019-06-16 | Discharge: 2019-06-16 | Disposition: A | Discharge status: Home / Self Care | Payer: BC Managed Care – PPO | Attending: General Surgery | Admitting: General Surgery

## 2019-06-16 ENCOUNTER — Ambulatory Visit (HOSPITAL_BASED_OUTPATIENT_CLINIC_OR_DEPARTMENT_OTHER): Payer: BC Managed Care – PPO | Admitting: Anesthesiology

## 2019-06-16 ENCOUNTER — Encounter (HOSPITAL_BASED_OUTPATIENT_CLINIC_OR_DEPARTMENT_OTHER)
Admission: RE | Disposition: A | Discharge status: Home / Self Care | Payer: Self-pay | Source: Home / Self Care | Attending: General Surgery

## 2019-06-16 ENCOUNTER — Encounter (HOSPITAL_BASED_OUTPATIENT_CLINIC_OR_DEPARTMENT_OTHER): Payer: Self-pay | Admitting: *Deleted

## 2019-06-16 DIAGNOSIS — M549 Dorsalgia, unspecified: Secondary | ICD-10-CM | POA: Diagnosis not present

## 2019-06-16 DIAGNOSIS — K605 Anorectal fistula: Secondary | ICD-10-CM | POA: Diagnosis not present

## 2019-06-16 DIAGNOSIS — Z79899 Other long term (current) drug therapy: Secondary | ICD-10-CM | POA: Diagnosis not present

## 2019-06-16 DIAGNOSIS — Z6834 Body mass index (BMI) 34.0-34.9, adult: Secondary | ICD-10-CM | POA: Insufficient documentation

## 2019-06-16 DIAGNOSIS — M1990 Unspecified osteoarthritis, unspecified site: Secondary | ICD-10-CM | POA: Insufficient documentation

## 2019-06-16 DIAGNOSIS — Z87891 Personal history of nicotine dependence: Secondary | ICD-10-CM | POA: Insufficient documentation

## 2019-06-16 DIAGNOSIS — F1911 Other psychoactive substance abuse, in remission: Secondary | ICD-10-CM | POA: Insufficient documentation

## 2019-06-16 DIAGNOSIS — Z833 Family history of diabetes mellitus: Secondary | ICD-10-CM | POA: Insufficient documentation

## 2019-06-16 DIAGNOSIS — K603 Anal fistula: Secondary | ICD-10-CM | POA: Diagnosis not present

## 2019-06-16 DIAGNOSIS — Z8261 Family history of arthritis: Secondary | ICD-10-CM | POA: Diagnosis not present

## 2019-06-16 DIAGNOSIS — E669 Obesity, unspecified: Secondary | ICD-10-CM | POA: Diagnosis not present

## 2019-06-16 HISTORY — PX: ANAL FISTULOTOMY: SHX6423

## 2019-06-16 HISTORY — DX: Unspecified osteoarthritis, unspecified site: M19.90

## 2019-06-16 SURGERY — EXAM UNDER ANESTHESIA
Anesthesia: Monitor Anesthesia Care | Site: Rectum

## 2019-06-16 MED ORDER — OXYCODONE HCL 5 MG/5ML PO SOLN
5.0000 mg | Freq: Once | ORAL | Status: DC | PRN
  Filled 2019-06-16: qty 5

## 2019-06-16 MED ORDER — MEPERIDINE HCL 25 MG/ML IJ SOLN
6.2500 mg | INTRAMUSCULAR | Status: DC | PRN
  Filled 2019-06-16: qty 1

## 2019-06-16 MED ORDER — LACTATED RINGERS IV SOLN
INTRAVENOUS | Status: DC
  Administered 2019-06-16: 10:00:00 via INTRAVENOUS
  Filled 2019-06-16: qty 1000

## 2019-06-16 MED ORDER — OXYCODONE HCL 5 MG PO TABS
5.0000 mg | ORAL_TABLET | Freq: Once | ORAL | Status: DC | PRN
  Filled 2019-06-16: qty 1

## 2019-06-16 MED ORDER — GABAPENTIN 300 MG PO CAPS
ORAL_CAPSULE | ORAL | Status: AC
  Filled 2019-06-16: qty 1

## 2019-06-16 MED ORDER — PROPOFOL 500 MG/50ML IV EMUL
INTRAVENOUS | Status: DC | PRN
  Administered 2019-06-16: 200 ug/kg/min via INTRAVENOUS

## 2019-06-16 MED ORDER — BUPIVACAINE-EPINEPHRINE 0.5% -1:200000 IJ SOLN
INTRAMUSCULAR | Status: DC | PRN
  Administered 2019-06-16: 30 mL

## 2019-06-16 MED ORDER — ACETAMINOPHEN 500 MG PO TABS
ORAL_TABLET | ORAL | Status: AC
  Filled 2019-06-16: qty 2

## 2019-06-16 MED ORDER — KETOROLAC TROMETHAMINE 30 MG/ML IJ SOLN
30.0000 mg | Freq: Once | INTRAMUSCULAR | Status: DC | PRN
  Filled 2019-06-16: qty 1

## 2019-06-16 MED ORDER — OXYCODONE HCL 5 MG PO TABS
5.0000 mg | ORAL_TABLET | ORAL | Status: DC | PRN
  Filled 2019-06-16: qty 2

## 2019-06-16 MED ORDER — ACETAMINOPHEN 650 MG RE SUPP
650.0000 mg | RECTAL | Status: DC | PRN
  Filled 2019-06-16: qty 1

## 2019-06-16 MED ORDER — SODIUM CHLORIDE 0.9 % IV SOLN
250.0000 mL | INTRAVENOUS | Status: DC | PRN
  Filled 2019-06-16: qty 250

## 2019-06-16 MED ORDER — ACETAMINOPHEN 325 MG PO TABS
650.0000 mg | ORAL_TABLET | ORAL | Status: DC | PRN
  Filled 2019-06-16: qty 2

## 2019-06-16 MED ORDER — GABAPENTIN 300 MG PO CAPS
300.0000 mg | ORAL_CAPSULE | ORAL | Status: AC
  Administered 2019-06-16: 300 mg via ORAL
  Filled 2019-06-16: qty 1

## 2019-06-16 MED ORDER — MIDAZOLAM HCL 2 MG/2ML IJ SOLN
INTRAMUSCULAR | Status: AC
  Filled 2019-06-16: qty 2

## 2019-06-16 MED ORDER — KETAMINE INJECTION FOR OPTIME (MG/KG/HR) DOCUMENTATION
INTRAVENOUS | Status: DC | PRN
  Administered 2019-06-16: 2 mg/kg/h via INTRAVENOUS

## 2019-06-16 MED ORDER — SODIUM CHLORIDE 0.9% FLUSH
3.0000 mL | Freq: Two times a day (BID) | INTRAVENOUS | Status: DC
  Filled 2019-06-16: qty 3

## 2019-06-16 MED ORDER — MIDAZOLAM HCL 2 MG/2ML IJ SOLN
INTRAMUSCULAR | Status: DC | PRN
  Administered 2019-06-16: 2 mg via INTRAVENOUS

## 2019-06-16 MED ORDER — PROMETHAZINE HCL 25 MG/ML IJ SOLN
6.2500 mg | INTRAMUSCULAR | Status: DC | PRN
  Filled 2019-06-16: qty 1

## 2019-06-16 MED ORDER — FENTANYL CITRATE (PF) 100 MCG/2ML IJ SOLN
25.0000 ug | INTRAMUSCULAR | Status: DC | PRN
  Filled 2019-06-16: qty 1

## 2019-06-16 MED ORDER — ONDANSETRON HCL 4 MG/2ML IJ SOLN
INTRAMUSCULAR | Status: DC | PRN
  Administered 2019-06-16: 4 mg via INTRAVENOUS

## 2019-06-16 MED ORDER — PROPOFOL 500 MG/50ML IV EMUL
INTRAVENOUS | Status: AC
  Filled 2019-06-16: qty 50

## 2019-06-16 MED ORDER — ACETAMINOPHEN 500 MG PO TABS
1000.0000 mg | ORAL_TABLET | ORAL | Status: AC
  Administered 2019-06-16: 1000 mg via ORAL
  Filled 2019-06-16: qty 2

## 2019-06-16 MED ORDER — SODIUM CHLORIDE 0.9% FLUSH
3.0000 mL | INTRAVENOUS | Status: DC | PRN
  Filled 2019-06-16: qty 3

## 2019-06-16 SURGICAL SUPPLY — 58 items
BENZOIN TINCTURE PRP APPL 2/3 (GAUZE/BANDAGES/DRESSINGS) ×3 IMPLANT
BLADE EXTENDED COATED 6.5IN (ELECTRODE) IMPLANT
BLADE HEX COATED 2.75 (ELECTRODE) ×3 IMPLANT
BLADE SURG 10 STRL SS (BLADE) IMPLANT
BRIEF STRETCH FOR OB PAD LRG (UNDERPADS AND DIAPERS) ×3 IMPLANT
CANISTER SUCT 3000ML PPV (MISCELLANEOUS) ×3 IMPLANT
COVER BACK TABLE 60X90IN (DRAPES) ×3 IMPLANT
COVER MAYO STAND STRL (DRAPES) ×3 IMPLANT
COVER WAND RF STERILE (DRAPES) ×3 IMPLANT
DECANTER SPIKE VIAL GLASS SM (MISCELLANEOUS) IMPLANT
DRAPE HYSTEROSCOPY (DRAPE) IMPLANT
DRAPE LAPAROTOMY 100X72 PEDS (DRAPES) ×3 IMPLANT
DRAPE SHEET LG 3/4 BI-LAMINATE (DRAPES) IMPLANT
DRAPE UTILITY XL STRL (DRAPES) ×3 IMPLANT
ELECT REM PT RETURN 9FT ADLT (ELECTROSURGICAL) ×3
ELECTRODE REM PT RTRN 9FT ADLT (ELECTROSURGICAL) ×1 IMPLANT
GAUZE SPONGE 4X4 12PLY STRL (GAUZE/BANDAGES/DRESSINGS) ×3 IMPLANT
GAUZE SPONGE 4X4 12PLY STRL LF (GAUZE/BANDAGES/DRESSINGS) ×3 IMPLANT
GLOVE BIO SURGEON STRL SZ 6.5 (GLOVE) ×2 IMPLANT
GLOVE BIO SURGEONS STRL SZ 6.5 (GLOVE) ×1
GLOVE BIOGEL PI IND STRL 7.0 (GLOVE) ×1 IMPLANT
GLOVE BIOGEL PI INDICATOR 7.0 (GLOVE) ×2
GOWN STRL REUS W/TWL XL LVL3 (GOWN DISPOSABLE) ×3 IMPLANT
HYDROGEN PEROXIDE 16OZ (MISCELLANEOUS) IMPLANT
IV CATH 14GX2 1/4 (CATHETERS) IMPLANT
IV CATH 18G SAFETY (IV SOLUTION) IMPLANT
KIT SIGMOIDOSCOPE (SET/KITS/TRAYS/PACK) IMPLANT
KIT TURNOVER CYSTO (KITS) ×3 IMPLANT
LEGGING LITHOTOMY PAIR STRL (DRAPES) IMPLANT
LOOP VESSEL MAXI BLUE (MISCELLANEOUS) IMPLANT
NEEDLE HYPO 22GX1.5 SAFETY (NEEDLE) ×3 IMPLANT
NS IRRIG 500ML POUR BTL (IV SOLUTION) ×3 IMPLANT
PACK BASIN DAY SURGERY FS (CUSTOM PROCEDURE TRAY) ×3 IMPLANT
PAD ABD 8X10 STRL (GAUZE/BANDAGES/DRESSINGS) ×3 IMPLANT
PAD ARMBOARD 7.5X6 YLW CONV (MISCELLANEOUS) IMPLANT
PAD PREP 24X48 CUFFED NSTRL (MISCELLANEOUS) IMPLANT
PENCIL BUTTON HOLSTER BLD 10FT (ELECTRODE) ×3 IMPLANT
SPONGE HEMORRHOID 8X3CM (HEMOSTASIS) IMPLANT
SPONGE SURGIFOAM ABS GEL 100 (HEMOSTASIS) IMPLANT
SPONGE SURGIFOAM ABS GEL 12-7 (HEMOSTASIS) IMPLANT
SUCTION FRAZIER HANDLE 10FR (MISCELLANEOUS)
SUCTION TUBE FRAZIER 10FR DISP (MISCELLANEOUS) IMPLANT
SUT CHROMIC 2 0 SH (SUTURE) ×3 IMPLANT
SUT CHROMIC 3 0 SH 27 (SUTURE) IMPLANT
SUT ETHIBOND 0 (SUTURE) IMPLANT
SUT VIC AB 2-0 SH 27 (SUTURE)
SUT VIC AB 2-0 SH 27XBRD (SUTURE) IMPLANT
SUT VIC AB 3-0 SH 18 (SUTURE) IMPLANT
SUT VIC AB 3-0 SH 27 (SUTURE)
SUT VIC AB 3-0 SH 27XBRD (SUTURE) IMPLANT
SYR 10ML LL (SYRINGE) IMPLANT
SYR CONTROL 10ML LL (SYRINGE) ×3 IMPLANT
TOWEL OR 17X26 10 PK STRL BLUE (TOWEL DISPOSABLE) ×3 IMPLANT
TRAY DSU PREP LF (CUSTOM PROCEDURE TRAY) ×3 IMPLANT
TUBE CONNECTING 12'X1/4 (SUCTIONS) ×1
TUBE CONNECTING 12X1/4 (SUCTIONS) ×2 IMPLANT
UNDERPAD 30X30 (UNDERPADS AND DIAPERS) ×3 IMPLANT
YANKAUER SUCT BULB TIP NO VENT (SUCTIONS) ×3 IMPLANT

## 2019-06-16 NOTE — Anesthesia Preprocedure Evaluation (Signed)
Anesthesia Evaluation  Patient identified by MRN, date of birth, ID band Patient awake    Reviewed: Allergy & Precautions, NPO status   Airway Mallampati: I       Dental no notable dental hx. (+) Teeth Intact   Pulmonary neg pulmonary ROS,    Pulmonary exam normal breath sounds clear to auscultation       Cardiovascular negative cardio ROS Normal cardiovascular exam Rhythm:Regular Rate:Normal     Neuro/Psych negative neurological ROS  negative psych ROS   GI/Hepatic negative GI ROS, Neg liver ROS,   Endo/Other  negative endocrine ROS  Renal/GU negative Renal ROS  negative genitourinary   Musculoskeletal   Abdominal (+) + obese,   Peds  Hematology negative hematology ROS (+)   Anesthesia Other Findings   Reproductive/Obstetrics                             Anesthesia Physical Anesthesia Plan  ASA: II  Anesthesia Plan: MAC   Post-op Pain Management:    Induction:   PONV Risk Score and Plan: 2 and Ondansetron, Dexamethasone and Midazolam  Airway Management Planned: Natural Airway, Nasal Cannula and Simple Face Mask  Additional Equipment: None  Intra-op Plan:   Post-operative Plan:   Informed Consent: I have reviewed the patients History and Physical, chart, labs and discussed the procedure including the risks, benefits and alternatives for the proposed anesthesia with the patient or authorized representative who has indicated his/her understanding and acceptance.       Plan Discussed with: CRNA  Anesthesia Plan Comments:         Anesthesia Quick Evaluation

## 2019-06-16 NOTE — Transfer of Care (Signed)
Immediate Anesthesia Transfer of Care Note  Patient: Brandon Hawkins  Procedure(s) Performed: ANAL EXAM UNDER ANESTHESIA (N/A Rectum) ANAL FISTULOTOMY (N/A Rectum)  Patient Location: PACU  Anesthesia Type:MAC  Level of Consciousness: awake, alert , oriented and patient cooperative  Airway & Oxygen Therapy: Patient Spontanous Breathing and Patient connected to nasal cannula oxygen  Post-op Assessment: Report given to RN and Post -op Vital signs reviewed and stable  Post vital signs: Reviewed and stable  Last Vitals:  Vitals Value Taken Time  BP 111/77 06/16/19 1136  Temp    Pulse 74 06/16/19 1137  Resp 18 06/16/19 1137  SpO2 98 % 06/16/19 1137  Vitals shown include unvalidated device data.  Last Pain:  Vitals:   06/16/19 1023  TempSrc:   PainSc: 6       Patients Stated Pain Goal: 7 (16/10/96 0454)  Complications: No apparent anesthesia complications

## 2019-06-16 NOTE — Op Note (Signed)
06/16/2019  11:29 AM  PATIENT:  Brandon Hawkins  58 y.o. male  Patient Care Team: [facility], [facility] [place] as PCP - General (Family Medicine)  PRE-OPERATIVE DIAGNOSIS:  ANAL FISTULA  POST-OPERATIVE DIAGNOSIS:  ANAL FISTULA  PROCEDURE:   ANAL EXAM UNDER ANESTHESIA ANAL FISTULOTOMY   Surgeon(s): Leighton Ruff, MD  ASSISTANT: none   ANESTHESIA:   local and MAC  SPECIMEN:  No Specimen  DISPOSITION OF SPECIMEN:  N/A  COUNTS:  YES  PLAN OF CARE: Discharge to home after PACU  PATIENT DISPOSITION:  PACU - hemodynamically stable.  INDICATION: 58 year old male who presents to the office with a perianal fistula.  We discussed exam under anesthesia with possible fistulotomy versus seton placement.   OR FINDINGS: Shallow anorectal fistula involving superficial portions of external sphincter only.  DESCRIPTION: the patient was identified in the preoperative holding area and taken to the OR where they were laid on the operating room table.  MAC anesthesia was induced without difficulty. The patient was then positioned in prone jackknife position with buttocks gently taped apart.  The patient was then prepped and draped in usual sterile fashion.  SCDs were noted to be in place prior to the initiation of anesthesia. A surgical timeout was performed indicating the correct patient, procedure, positioning and need for preoperative antibiotics.  A rectal block was performed using Marcaine with epinephrine.  I began by placing a Hill-Ferguson anoscope into the anal canal and evaluating circumferentially.  Patient had grade 2 internal hemorrhoids with minimal signs of inflammation.  He had a posterior midline external opening.  I placed an S-shaped fistula probe into this and it entered into the anal cavity just above the most distal portion of the internal sphincter.  I divided a small amount of external sphincter by performing my fistulotomy.  The edges were then marsupialized  using a 2-0 chromic running suture.  A sterile dressing was then applied.  The patient was awakened from anesthesia and sent to the postanesthesia care unit in stable condition.  All counts are correct per operating room staff.

## 2019-06-16 NOTE — Anesthesia Procedure Notes (Signed)
Procedure Name: MAC Date/Time: 06/16/2019 11:14 AM Performed by: Wanita Chamberlain, CRNA Pre-anesthesia Checklist: Patient identified, Emergency Drugs available, Suction available, Patient being monitored and Timeout performed Patient Re-evaluated:Patient Re-evaluated prior to induction Oxygen Delivery Method: Nasal cannula Induction Type: IV induction Placement Confirmation: breath sounds checked- equal and bilateral,  CO2 detector and positive ETCO2 Dental Injury: Teeth and Oropharynx as per pre-operative assessment

## 2019-06-16 NOTE — Discharge Instructions (Addendum)
ANORECTAL SURGERY: POST OP INSTRUCTIONS 1. Take your usually prescribed home medications unless otherwise directed. 2. DIET: During the first few hours after surgery sip on some liquids until you are able to urinate.  It is normal to not urinate for several hours after this surgery.  If you feel uncomfortable, please contact the office for instructions.  After you are able to urinate,you may eat, if you feel like it.  Follow a light bland diet the first 24 hours after arrival home, such as soup, liquids, crackers, etc.  Be sure to include lots of fluids daily (6-8 glasses).  Avoid fast food or heavy meals, as your are more likely to get nauseated.  Eat a low fat diet the next few days after surgery.  Limit caffeine intake to 1-2 servings a day. 3. PAIN CONTROL: a. Pain is best controlled by a usual combination of several different methods TOGETHER: i. Muscle relaxation: Soak in a warm bath (or Sitz bath) three times a day and after bowel movements.  Continue to do this until all pain is resolved. ii. Over the counter pain medication iii. Prescription pain medication b. Most patients will experience some swelling and discomfort in the anus/rectal area and incisions.  Heat such as warm towels, sitz baths, warm baths, etc to help relax tight/sore spots and speed recovery.  Some people prefer to use ice, especially in the first couple days after surgery, as it may decrease the pain and swelling, or alternate between ice & heat.  Experiment to what works for you.  Swelling and bruising can take several weeks to resolve.  Pain can take even longer to completely resolve. c. It is helpful to take an over-the-counter pain medication regularly for the first few weeks.  Choose one of the following that works best for you: i. Naproxen (Aleve, etc)  Two 220mg  tabs twice a day ii. Ibuprofen (Advil, etc) Three 200mg  tabs four times a day (every meal & bedtime) d.   Take your pain medication as prescribed.  i. If you  are having problems/concerns with the prescription medicine (does not control pain, nausea, vomiting, rash, itching, etc), please call us [phone] to see if we need to switch you to a different pain medicine that will work better for you and/or control your side effect better. ii. If you need a refill on your pain medication, please contact your pharmacy.  They will contact our office to request authorization. Prescriptions will not be filled after 5 pm or on week-ends. 4. KEEP YOUR BOWELS REGULAR and AVOID CONSTIPATION a. The goal is one to two soft bowel movements a day.  You should at least have a bowel movement every other day. b. Avoid getting constipated.  Between the surgery and the pain medications, it is common to experience some constipation. This can be very painful after rectal surgery.  Increasing fluid intake and taking a fiber supplement (such as Metamucil, Citrucel, FiberCon, etc) 1-2 times a day regularly will usually help prevent this problem from occurring.  A stool softener like colace is also recommended.  This can be purchased over the counter at your pharmacy.  You can take it up to 3 times a day.  If you do not have a bowel movement after 24 hrs since your surgery, take one does of milk of magnesia.  If you still haven't had a bowel movement 8-12 hours after that dose, take another dose.  If you don't have a bowel movement 48 hrs after surgery, purchase  a Fleets enema from the drug store and administer gently per package instructions.  If you still are having trouble with your bowel movements after that, please call the office for further instructions. c. If you develop diarrhea or have many loose bowel movements, simplify your diet to bland foods & liquids for a few days.  Stop any stool softeners and decrease your fiber supplement.  Switching to mild anti-diarrheal medications (Kayopectate, Pepto Bismol) can help.  If this worsens or does not improve, please call us.  5. Wound  Care a. Remove your bandages before your first bowel movement or 8 hours after surgery.     b. Remove any wound packing material at this tim,e as well.  You do not need to repack the wound unless instructed otherwise.  Wear an absorbent pad or soft cotton gauze in your underwear to catch any drainage and help keep the area clean. You should change this every 2-3 hours while awake. c. Keep the area clean and dry.  Bathe / shower every day, especially after bowel movements.  Keep the area clean by showering / bathing over the incision / wound.   It is okay to soak an open wound to help wash it.  Wet wipes or showers / gentle washing after bowel movements is often less traumatic than regular toilet paper. d. Bonita QuinYou may have some styrofoam-like soft packing in the rectum which will come out with the first bowel movement.  e. You will often notice bleeding with bowel movements.  This should slow down by the end of the first week of surgery f. Expect some drainage.  This should slow down, too, by the end of the first week of surgery.  Wear an absorbent pad or soft cotton gauze in your underwear until the drainage stops. g. Do Not sit on a rubber or pillow ring.  This can make you symptoms worse.  You may sit on a soft pillow if needed.  6. ACTIVITIES as tolerated:   a. You may resume regular (light) daily activities beginning the next day--such as daily self-care, walking, climbing stairs--gradually increasing activities as tolerated.  If you can walk 30 minutes without difficulty, it is safe to try more intense activity such as jogging, treadmill, bicycling, low-impact aerobics, swimming, etc. b. Save the most intensive and strenuous activity for last such as sit-ups, heavy lifting, contact sports, etc  Refrain from any heavy lifting or straining until you are off narcotics for pain control.   c. You may drive when you are no longer taking prescription pain medication, you can comfortably sit for long periods of  time, and you can safely maneuver your car and apply brakes. d. Bonita QuinYou may have sexual intercourse when it is comfortable.  7. FOLLOW UP in our office a. Please call [facility] at [phone] to set up an appointment to see your surgeon in the office for a follow-up appointment approximately 3-4 weeks after your surgery. b. Make sure that you call for this appointment the day you arrive home to insure a convenient appointment time. 10. IF YOU HAVE DISABILITY OR FAMILY LEAVE FORMS, BRING THEM TO THE OFFICE FOR PROCESSING.  DO NOT GIVE THEM TO YOUR DOCTOR.     WHEN TO CALL US [phone]: 1. Poor pain control 2. Reactions / problems with new medications (rash/itching, nausea, etc)  3. Fever over 101.5 F (38.5 C) 4. Inability to urinate 5. Nausea and/or vomiting 6. Worsening swelling or bruising 7. Continued bleeding from incision. 8.  Increased pain, redness, or drainage from the incision  The clinic staff is available to answer your questions during regular business hours (8:30am-5pm).  Please dont hesitate to call and ask to speak to one of our nurses for clinical concerns.   A surgeon from [facility] is always on call at the hospitals   If you have a medical emergency, go to the nearest emergency room or call 911.    [facility], [place], [place], [place], [place]  [address] ? MAIN: [phone] ? TOLL FREE: [phone] ? FAX [phone] [email or web address]   Post Anesthesia Home Care Instructions  Activity: Get plenty of rest for the remainder of the day. A responsible individual must stay with you for 24 hours following the procedure.  For the next 24 hours, DO NOT: -Drive a car -Paediatric nurse -Drink alcoholic beverages -Take any medication unless instructed by your physician -Make any legal decisions or sign important papers.  Meals: Start with liquid foods such as gelatin or soup. Progress to regular foods  as tolerated. Avoid greasy, spicy, heavy foods. If nausea and/or vomiting occur, drink only clear liquids until the nausea and/or vomiting subsides. Call your physician if vomiting continues.  Special Instructions/Symptoms: Your throat may feel dry or sore from the anesthesia or the breathing tube placed in your throat during surgery. If this causes discomfort, gargle with warm salt water. The discomfort should disappear within 24 hours.

## 2019-06-16 NOTE — Anesthesia Postprocedure Evaluation (Signed)
Anesthesia Post Note  Patient: Brandon Hawkins  Procedure(s) Performed: ANAL EXAM UNDER ANESTHESIA (N/A Rectum) ANAL FISTULOTOMY (N/A Rectum)     Patient location during evaluation: Phase II Anesthesia Type: MAC Level of consciousness: awake Pain management: pain level controlled Vital Signs Assessment: post-procedure vital signs reviewed and stable Respiratory status: spontaneous breathing Cardiovascular status: stable Postop Assessment: no apparent nausea or vomiting Anesthetic complications: no    Last Vitals:  Vitals:   06/16/19 1145 06/16/19 1200  BP: 129/87 125/85  Pulse: 77 73  Resp: 15 20  Temp:    SpO2: 95% 97%    Last Pain:  Vitals:   06/16/19 1200  TempSrc:   PainSc: 0-No pain   Pain Goal: Patients Stated Pain Goal: 7 (06/16/19 1023)                 Huston Foley

## 2019-06-16 NOTE — Interval H&P Note (Signed)
History and Physical Interval Note:  06/16/2019 9:54 AM  Brandon Hawkins  has presented today for surgery, with the diagnosis of ANAL FISTULA.  The various methods of treatment have been discussed with the patient and family. After consideration of risks, benefits and other options for treatment, the patient has consented to  Procedure(s): ANAL EXAM UNDER ANESTHESIA (N/A) ANAL FISTULOTOMY VS SETON PLACEMENT (N/A) as a surgical intervention.  The patient's history has been reviewed, patient examined, no change in status, stable for surgery.  I have reviewed the patient's chart and labs.  Questions were answered to the patient's satisfaction.     Rosario Adie, MD  [facility]

## 2019-06-17 ENCOUNTER — Encounter (HOSPITAL_BASED_OUTPATIENT_CLINIC_OR_DEPARTMENT_OTHER): Payer: Self-pay | Admitting: General Surgery

## 2019-07-14 DIAGNOSIS — Z96651 Presence of right artificial knee joint: Secondary | ICD-10-CM | POA: Diagnosis not present

## 2019-07-14 DIAGNOSIS — M25561 Pain in right knee: Secondary | ICD-10-CM | POA: Diagnosis not present

## 2019-08-12 DIAGNOSIS — L02224 Furuncle of groin: Secondary | ICD-10-CM | POA: Diagnosis not present

## 2019-08-12 DIAGNOSIS — R21 Rash and other nonspecific skin eruption: Secondary | ICD-10-CM | POA: Diagnosis not present

## 2019-08-12 DIAGNOSIS — L309 Dermatitis, unspecified: Secondary | ICD-10-CM | POA: Diagnosis not present

## 2019-09-28 DIAGNOSIS — L730 Acne keloid: Secondary | ICD-10-CM | POA: Diagnosis not present

## 2019-09-28 DIAGNOSIS — L732 Hidradenitis suppurativa: Secondary | ICD-10-CM | POA: Diagnosis not present

## 2019-10-11 DIAGNOSIS — Z96651 Presence of right artificial knee joint: Secondary | ICD-10-CM | POA: Diagnosis not present

## 2019-10-11 DIAGNOSIS — M25562 Pain in left knee: Secondary | ICD-10-CM | POA: Diagnosis not present

## 2019-10-11 DIAGNOSIS — M25561 Pain in right knee: Secondary | ICD-10-CM | POA: Diagnosis not present

## 2020-02-02 ENCOUNTER — Ambulatory Visit: Payer: BC Managed Care – PPO | Attending: Internal Medicine

## 2020-02-02 DIAGNOSIS — Z23 Encounter for immunization: Secondary | ICD-10-CM

## 2020-02-02 NOTE — Progress Notes (Signed)
   Covid-19 Vaccination Clinic  Name:  Brandon Hawkins    MRN: [id number] DOB: [birth year]  02/02/2020  Mr. [name] was observed post Covid-19 immunization for 15 minutes without incident. He was provided with Vaccine Information Sheet and instruction to access the V-Safe system.   Mr. [name] was instructed to call 911 with any severe reactions post vaccine: Marland Kitchen Difficulty breathing  . Swelling of face and throat  . A fast heartbeat  . A bad rash all over body  . Dizziness and weakness   Immunizations Administered    Name Date Dose VIS Date Route   Pfizer COVID-19 Vaccine 02/02/2020  4:04 PM 0.3 mL 10/14/2019 Intramuscular   Manufacturer: [employer], [employer]   Lot: [id number]   NDC: [address]-4

## 2020-02-27 ENCOUNTER — Ambulatory Visit: Payer: Self-pay | Attending: Internal Medicine

## 2020-02-27 DIAGNOSIS — Z23 Encounter for immunization: Secondary | ICD-10-CM

## 2020-02-27 NOTE — Progress Notes (Signed)
   Covid-19 Vaccination Clinic  Name:  CHANNIN AGUSTIN    MRN: [id number] DOB: 1961/06/14  02/27/2020  Mr. Fellner was observed post Covid-19 immunization for 15 minutes without incident. He was provided with Vaccine Information Sheet and instruction to access the V-Safe system.   Mr. Foti was instructed to call 911 with any severe reactions post vaccine: Marland Kitchen Difficulty breathing  . Swelling of face and throat  . A fast heartbeat  . A bad rash all over body  . Dizziness and weakness   Immunizations Administered    Name Date Dose VIS Date Route   Pfizer COVID-19 Vaccine 02/27/2020  2:56 PM 0.3 mL 12/28/2018 Intramuscular   Manufacturer: [employer], [employer]   Lot: [id number]   NDC: [address]-7

## 2020-07-04 ENCOUNTER — Emergency Department (HOSPITAL_BASED_OUTPATIENT_CLINIC_OR_DEPARTMENT_OTHER): Payer: Managed Care, Other (non HMO)

## 2020-07-04 ENCOUNTER — Encounter (HOSPITAL_BASED_OUTPATIENT_CLINIC_OR_DEPARTMENT_OTHER): Payer: Self-pay | Admitting: *Deleted

## 2020-07-04 ENCOUNTER — Observation Stay (HOSPITAL_BASED_OUTPATIENT_CLINIC_OR_DEPARTMENT_OTHER)
Admission: EM | Admit: 2020-07-04 | Discharge: 2020-07-06 | Disposition: A | Discharge status: Home / Self Care | Payer: Managed Care, Other (non HMO) | Attending: Family Medicine | Admitting: Family Medicine

## 2020-07-04 ENCOUNTER — Other Ambulatory Visit: Payer: Self-pay

## 2020-07-04 DIAGNOSIS — I2694 Multiple subsegmental pulmonary emboli without acute cor pulmonale: Secondary | ICD-10-CM

## 2020-07-04 DIAGNOSIS — R0602 Shortness of breath: Secondary | ICD-10-CM | POA: Diagnosis present

## 2020-07-04 DIAGNOSIS — Z96652 Presence of left artificial knee joint: Secondary | ICD-10-CM | POA: Insufficient documentation

## 2020-07-04 DIAGNOSIS — I2699 Other pulmonary embolism without acute cor pulmonale: Secondary | ICD-10-CM | POA: Diagnosis not present

## 2020-07-04 DIAGNOSIS — Z20822 Contact with and (suspected) exposure to covid-19: Secondary | ICD-10-CM | POA: Diagnosis not present

## 2020-07-04 DIAGNOSIS — R079 Chest pain, unspecified: Secondary | ICD-10-CM | POA: Insufficient documentation

## 2020-07-04 LAB — CBC WITH DIFFERENTIAL/PLATELET
Abs Immature Granulocytes: 0.05 10*3/uL (ref 0.00–0.07)
Basophils Absolute: 0.1 10*3/uL (ref 0.0–0.1)
Basophils Relative: 1 %
Eosinophils Absolute: 0.2 10*3/uL (ref 0.0–0.5)
Eosinophils Relative: 2 %
HCT: 43 % (ref 39.0–52.0)
Hemoglobin: 14.4 g/dL (ref 13.0–17.0)
Immature Granulocytes: 1 %
Lymphocytes Relative: 21 %
Lymphs Abs: 2.2 10*3/uL (ref 0.7–4.0)
MCH: 27.5 pg (ref 26.0–34.0)
MCHC: 33.5 g/dL (ref 30.0–36.0)
MCV: 82.2 fL (ref 80.0–100.0)
Monocytes Absolute: 0.8 10*3/uL (ref 0.1–1.0)
Monocytes Relative: 7 %
Neutro Abs: 7.4 10*3/uL (ref 1.7–7.7)
Neutrophils Relative %: 68 %
Platelets: 291 10*3/uL (ref 150–400)
RBC: 5.23 MIL/uL (ref 4.22–5.81)
RDW: 13.4 % (ref 11.5–15.5)
WBC: 10.6 10*3/uL — ABNORMAL HIGH (ref 4.0–10.5)
nRBC: 0 % (ref 0.0–0.2)

## 2020-07-04 LAB — COMPREHENSIVE METABOLIC PANEL
ALT: 11 U/L (ref 0–44)
AST: 14 U/L — ABNORMAL LOW (ref 15–41)
Albumin: 4.1 g/dL (ref 3.5–5.0)
Alkaline Phosphatase: 83 U/L (ref 38–126)
Anion gap: 12 (ref 5–15)
BUN: 17 mg/dL (ref 8–23)
CO2: 24 mmol/L (ref 22–32)
Calcium: 9.2 mg/dL (ref 8.9–10.3)
Chloride: 102 mmol/L (ref 98–111)
Creatinine, Ser: 1.09 mg/dL (ref 0.61–1.24)
GFR calc Af Amer: >60 mL/min (ref >60)
GFR calc non Af Amer: >60 mL/min (ref >60)
Glucose, Bld: 109 mg/dL — ABNORMAL HIGH (ref 70–99)
Potassium: 3.5 mmol/L (ref 3.5–5.1)
Sodium: 138 mmol/L (ref 135–145)
Total Bilirubin: 0.5 mg/dL (ref 0.3–1.2)
Total Protein: 7.3 g/dL (ref 6.5–8.1)

## 2020-07-04 LAB — TROPONIN I (HIGH SENSITIVITY): Troponin I (High Sensitivity): 15 ng/L (ref <18)

## 2020-07-04 LAB — D-DIMER, QUANTITATIVE: D-Dimer, Quant: 1.81 ug/mL-FEU — ABNORMAL HIGH (ref 0.00–0.50)

## 2020-07-04 MED ORDER — IOHEXOL 350 MG/ML SOLN
100.0000 mL | Freq: Once | INTRAVENOUS | Status: AC | PRN
  Administered 2020-07-04: 100 mL via INTRAVENOUS

## 2020-07-04 NOTE — ED Notes (Signed)
Case discussed with EDP Butler-orders received

## 2020-07-04 NOTE — ED Triage Notes (Addendum)
Pt c/o SOB with exertion  , chest pain x 1 day , sent here by PMD for dimer 2.38, recent knee surgery x 1 week ago

## 2020-07-05 DIAGNOSIS — I2699 Other pulmonary embolism without acute cor pulmonale: Secondary | ICD-10-CM | POA: Diagnosis present

## 2020-07-05 DIAGNOSIS — I2694 Multiple subsegmental pulmonary emboli without acute cor pulmonale: Secondary | ICD-10-CM | POA: Diagnosis not present

## 2020-07-05 LAB — HEPARIN LEVEL (UNFRACTIONATED)
Heparin Unfractionated: 1.22 IU/mL — ABNORMAL HIGH (ref 0.30–0.70)
Heparin Unfractionated: 0.19 IU/mL — ABNORMAL LOW (ref 0.30–0.70)

## 2020-07-05 LAB — SARS CORONAVIRUS 2 BY RT PCR (HOSPITAL ORDER, PERFORMED IN ~~LOC~~ HOSPITAL LAB): SARS Coronavirus 2: NEGATIVE

## 2020-07-05 MED ORDER — HEPARIN BOLUS VIA INFUSION
5000.0000 [IU] | Freq: Once | INTRAVENOUS | Status: AC
  Administered 2020-07-05: 5000 [IU] via INTRAVENOUS

## 2020-07-05 MED ORDER — AMLODIPINE BESYLATE 5 MG PO TABS
5.0000 mg | ORAL_TABLET | Freq: Every day | ORAL | Status: DC
  Administered 2020-07-05 – 2020-07-06 (×2): 5 mg via ORAL
  Filled 2020-07-05: qty 1

## 2020-07-05 MED ORDER — OXYCODONE-ACETAMINOPHEN 5-325 MG PO TABS
1.0000 | ORAL_TABLET | ORAL | Status: DC | PRN
  Administered 2020-07-05 – 2020-07-06 (×3): 1 via ORAL
  Filled 2020-07-05 (×3): qty 1

## 2020-07-05 MED ORDER — HEPARIN (PORCINE) 25000 UT/250ML-% IV SOLN
1750.0000 [IU]/h | INTRAVENOUS | Status: DC
  Administered 2020-07-05: 1750 [IU]/h via INTRAVENOUS
  Filled 2020-07-05: qty 250

## 2020-07-05 MED ORDER — HEPARIN (PORCINE) 25000 UT/250ML-% IV SOLN
1950.0000 [IU]/h | INTRAVENOUS | Status: DC
  Administered 2020-07-05: 1550 [IU]/h via INTRAVENOUS
  Administered 2020-07-06: 1950 [IU]/h via INTRAVENOUS
  Filled 2020-07-05 (×2): qty 250

## 2020-07-05 MED ORDER — OXYCODONE-ACETAMINOPHEN 5-325 MG PO TABS
1.0000 | ORAL_TABLET | ORAL | Status: DC | PRN
Start: 2020-07-05 — End: 2020-07-05

## 2020-07-05 NOTE — ED Notes (Signed)
pts wife at bedside. Pt eating and will then be given a pain med

## 2020-07-05 NOTE — ED Notes (Signed)
Spoke with Freddy Jaksch, recieiving nurse at [facility], informed of [facility] being en route for transport

## 2020-07-05 NOTE — ED Notes (Signed)
Pt given snack and water. Family remains at bedside. No distress noted.

## 2020-07-05 NOTE — Progress Notes (Signed)
ANTICOAGULATION CONSULT NOTE - Follow Up Consult  Pharmacy Consult for heparin Indication: pulmonary embolus  No Known Allergies  Patient Measurements: Height: 6\' 2"  (188 cm) Weight: 123.7 kg (272 lb 11.3 oz) IBW/kg (Calculated) : 82.2 Heparin Dosing Weight: 110 kg  Vital Signs: Temp: 98.4 F (36.9 C) (09/02 1844) Temp Source: Oral (09/02 1844) BP: 147/110 (09/02 1844) Pulse Rate: 54 (09/02 1844)  Labs: Recent Labs    07/04/20 2146 07/05/20 1115 07/05/20 2056  HGB 14.4  --   --   HCT 43.0  --   --   PLT 291  --   --   HEPARINUNFRC  --  1.22* 0.19*  CREATININE 1.09  --   --   TROPONINIHS 15  --   --     Estimated Creatinine Clearance: 99.5 mL/min (by C-G formula based on SCr of 1.09 mg/dL).   Medications:  Scheduled:   amLODipine  5 mg Oral Daily    Assessment: Heparin level came back subtherapeutic this PM at 0.19. Will increase rate and check level in AM.   Goal of Therapy:  Heparin level 0.3-0.7 units/ml Monitor platelets by anticoagulation protocol: Yes   Plan:  Increase heparin to 1750 units/hr Daily heparin level and CBC F/u with oral NOAC transition  09/04/20, PharmD, [place], AAHIVP, CPP Infectious Disease Pharmacist 07/05/2020 9:53 PM

## 2020-07-05 NOTE — Progress Notes (Signed)
ANTICOAGULATION CONSULT NOTE - Follow Up Consult  Pharmacy Consult for heparin Indication: pulmonary embolus  Not on File  Patient Measurements: Height: 6\' 2"  (188 cm) Weight: 122.5 kg (270 lb) IBW/kg (Calculated) : 82.2 Heparin Dosing Weight: 110 kg  Vital Signs: BP: 147/88 (09/02 1200) Pulse Rate: 58 (09/02 1200)  Labs: Recent Labs    07/04/20 2146 07/05/20 1115  HGB 14.4  --   HCT 43.0  --   PLT 291  --   HEPARINUNFRC  --  1.22*  CREATININE 1.09  --   TROPONINIHS 15  --     Estimated Creatinine Clearance: 99 mL/min (by C-G formula based on SCr of 1.09 mg/dL).   Medications:  Scheduled:    Assessment: Heparin level ~ 6 hrs after bolus/infusion = 1.1 units/mL on 1750 units per hour; no complications noted, patient condition as above. Goal of Therapy:  Heparin level 0.3-0.7 units/ml Monitor platelets by anticoagulation protocol: Yes   Plan:  Hold heparin infusion x 1 hour; restart at 1550 units per hour Check anti-Xa level in 6 hours and daily while on heparin Continue to monitor H&H and platelets  09/04/20 A Particia Strahm 07/05/2020,1:58 PM

## 2020-07-05 NOTE — ED Notes (Signed)
Report given to Paul RN with [facility]. 

## 2020-07-05 NOTE — Progress Notes (Signed)
Patient admitted from [facility], stable on heparin gtt at 1550 units/kg/hr. Attending informed of patient arrival and came to see patient.

## 2020-07-05 NOTE — ED Provider Notes (Signed)
[facility] EMERGENCY DEPARTMENT Provider Note   CSN: [id number] Arrival date & time: 07/04/20  2133     History Chief Complaint  Patient presents with  . Shortness of Breath    Brandon Hawkins is a 59 y.o. male.  Patient presents to the emergency department for shortness of breath.  Patient recently had left knee surgery.  He has been noticing shortness of breath with exertion and now at rest.  Wife reports that last night he started having chest discomfort and had to jump out of bed because he could not breathe.  Primary doctor did a D-dimer as an outpatient which was elevated, sent to the ED for further evaluation.  Patient has not noticed any unusual leg swelling, surgical site is healing well.   Shortness of Breath Associated symptoms: chest pain        Past Medical History:  Diagnosis Date  . Arthritis   . Eczema     There are no problems to display for this patient.   Past Surgical History:  Procedure Laterality Date  . ANAL FISTULOTOMY N/A 06/16/2019   Procedure: ANAL FISTULOTOMY;  Surgeon: Romie Levee, MD;  Location: [facility];  Service: General;  Laterality: N/A;  . NO PAST SURGERIES    . REPLACEMENT TOTAL KNEE Right 2020       No family history on file.  Social History   Tobacco Use  . Smoking status: Never Smoker  . Smokeless tobacco: Never Used  Substance Use Topics  . Alcohol use: Yes  . Drug use: No    Home Medications Prior to Admission medications   Medication Sig Start Date End Date Taking? Authorizing Provider  oxyCODONE-acetaminophen (PERCOCET/ROXICET) 5-325 MG tablet Take by mouth every 4 (four) hours as needed for severe pain.    [provider]    Allergies    Patient has no allergy information on record.  Review of Systems   Review of Systems  Respiratory: Positive for shortness of breath.   Cardiovascular: Positive for chest pain.  All other systems reviewed and are  negative.   Physical Exam Updated Vital Signs BP (!) 162/87   Pulse (!) 54   Temp 98.9 F (37.2 C)   Resp 18   Ht 6\' 2"  (1.88 m)   Wt 122.5 kg   SpO2 99%   BMI 34.67 kg/m   Physical Exam Vitals and nursing note reviewed.  Constitutional:      General: He is not in acute distress.    Appearance: Normal appearance. He is well-developed.  HENT:     Head: Normocephalic and atraumatic.     Right Ear: Hearing normal.     Left Ear: Hearing normal.     Nose: Nose normal.  Eyes:     Conjunctiva/sclera: Conjunctivae normal.     Pupils: Pupils are equal, round, and reactive to light.  Cardiovascular:     Rate and Rhythm: Regular rhythm.     Heart sounds: S1 normal and S2 normal. No murmur heard.  No friction rub. No gallop.   Pulmonary:     Effort: Pulmonary effort is normal. No respiratory distress.     Breath sounds: Normal breath sounds.  Chest:     Chest wall: No tenderness.  Abdominal:     General: Bowel sounds are normal.     Palpations: Abdomen is soft.     Tenderness: There is no abdominal tenderness. There is no guarding or rebound. Negative signs include Murphy's sign and  McBurney's sign.     Hernia: No hernia is present.  Musculoskeletal:        General: Normal range of motion.     Cervical back: Normal range of motion and neck supple.     Left knee: No swelling, deformity, effusion or erythema. No tenderness.     Right lower leg: No swelling. No edema.     Left lower leg: No swelling. No edema.  Skin:    General: Skin is warm and dry.     Findings: No rash.  Neurological:     Mental Status: He is alert and oriented to person, place, and time.     GCS: GCS eye subscore is 4. GCS verbal subscore is 5. GCS motor subscore is 6.     Cranial Nerves: No cranial nerve deficit.     Sensory: No sensory deficit.     Coordination: Coordination normal.  Psychiatric:        Speech: Speech normal.        Behavior: Behavior normal.        Thought Content: Thought content  normal.     ED Results / Procedures / Treatments   Labs (all labs ordered are listed, but only abnormal results are displayed) Labs Reviewed  CBC WITH DIFFERENTIAL/PLATELET - Abnormal; Notable for the following components:      Result Value   WBC 10.6 (*)    All other components within normal limits  COMPREHENSIVE METABOLIC PANEL - Abnormal; Notable for the following components:   Glucose, Bld 109 (*)    AST 14 (*)    All other components within normal limits  D-DIMER, QUANTITATIVE (NOT AT [facility]) - Abnormal; Notable for the following components:   D-Dimer, Quant 1.81 (*)    All other components within normal limits  TROPONIN I (HIGH SENSITIVITY)    EKG EKG Interpretation  Date/Time:  Wednesday July 04 2020 21:50:00 EDT Ventricular Rate:  62 PR Interval:    QRS Duration: 82 QT Interval:  438 QTC Calculation: 444 R Axis:   56 Text Interpretation: Normal sinus rhythm with sinus arrhythmia ST & T wave abnormality, consider anterior ischemia Abnormal ECG No significant change since prior 5/20 Confirmed by Meridee Score [phone]) on 07/04/2020 10:06:03 PM   Radiology CT Angio Chest PE W and/or Wo Contrast  Result Date: 07/04/2020 CLINICAL DATA:  Positive D-dimer short of breath recent knee surgery EXAM: CT ANGIOGRAPHY CHEST WITH CONTRAST TECHNIQUE: Multidetector CT imaging of the chest was performed using the standard protocol during bolus administration of intravenous contrast. Multiplanar CT image reconstructions and MIPs were obtained to evaluate the vascular anatomy. CONTRAST:  OMNIPAQUE IOHEXOL 350 MG/ML SOLN COMPARISON:  Chest x-ray 03/21/2019 FINDINGS: Cardiovascular: Satisfactory opacification of the pulmonary arteries to the segmental level. Nonocclusive filling defect within the right distal descending pulmonary artery with multiple small emboli present within distal segmental and subsegmental right upper lobe, right middle lobe and right lower lobe pulmonary vessels.  Small embolus within subsegmental left upper lobe vessel. No elevation of RV LV ratio. Ectatic ascending aorta measuring up to 3.9 cm. Mild aortic atherosclerosis. No dissection is seen. Cardiomegaly without pericardial effusion Mediastinum/Nodes: No enlarged mediastinal, hilar, or axillary lymph nodes. Left lobe of thyroid appears absent. No mediastinal adenopathy. Esophagus within normal limits. Lungs/Pleura: No consolidation or pleural effusion. Mild subpleural ground-glass density in the right lung base, probably atelectasis though small pulmonary infarct is possible. Upper Abdomen: No acute abnormality. Musculoskeletal: No chest wall abnormality. No acute or significant osseous  findings. Review of the MIP images confirms the above findings. IMPRESSION: 1. Positive for acute pulmonary emboli within the right greater than left pulmonary vessels. Negative for right heart strain. 2. Mild subpleural ground-glass density in the right lung base, probably atelectasis though small pulmonary infarct is also a consideration. 3. Aortic atherosclerosis. Critical Value/emergent results were called by telephone at the time of interpretation on 07/04/2020 at 11:36 pm to provider [facility] , who verbally acknowledged these results. Aortic Atherosclerosis (ICD10-I70.0). Electronically Signed   By: Jasmine Pang M.D.   On: 07/04/2020 23:36    Procedures Procedures (including critical care time)  Medications Ordered in ED Medications  iohexol (OMNIPAQUE) 350 MG/ML injection 100 mL (100 mLs Intravenous Contrast Given 07/04/20 2317)    ED Course  I have reviewed the triage vital signs and the nursing notes.  Pertinent labs & imaging results that were available during my care of the patient were reviewed by me and considered in my medical decision making (see chart for details).    MDM Rules/Calculators/A&P                          Patient presents to the emergency department with concern over possible PE.   Patient recently had knee surgery.  He has been experiencing dyspnea on exertion and now mild chest pain.  PCP performed outpatient D-dimer which was elevated and patient was therefore sent to the ER.  CT angiography confirms multiple pulmonary emboli.  He does not have any evidence of right heart strain.  No hypoxia or oxygen demand.  Discussed possibility of discharge with Xarelto or Eliquis.  Patient's wife was very concerned about discharge and asked patient to be monitored in the hospital.  Patient initiated on heparin and will be admitted.  Final Clinical Impression(s) / ED Diagnoses Final diagnoses:  None    Rx / DC Orders ED Discharge Orders    None       Gitty Osterlund, Canary Brim, MD 07/05/20 [phone]

## 2020-07-05 NOTE — Progress Notes (Signed)
ANTICOAGULATION CONSULT NOTE - Initial Consult  Pharmacy Consult for Heparin Indication: pulmonary embolus  Not on File  Patient Measurements: Height: 6\' 2"  (188 cm) Weight: 122.5 kg (270 lb) IBW/kg (Calculated) : 82.2 Heparin Dosing Weight: 110 kg  Vital Signs: Temp: 98.9 F (37.2 C) (09/01 2141) BP: 157/99 (09/02 0200) Pulse Rate: 53 (09/02 0200)  Labs: Recent Labs    07/04/20 2146  HGB 14.4  HCT 43.0  PLT 291  CREATININE 1.09  TROPONINIHS 15    Estimated Creatinine Clearance: 99 mL/min (by C-G formula based on SCr of 1.09 mg/dL).   Medical History: Past Medical History:  Diagnosis Date  . Arthritis   . Eczema     Medications:  No current facility-administered medications on file prior to encounter.   Current Outpatient Medications on File Prior to Encounter  Medication Sig Dispense Refill  . oxyCODONE-acetaminophen (PERCOCET/ROXICET) 5-325 MG tablet Take by mouth every 4 (four) hours as needed for severe pain.       Assessment: 59 y.o. male with bilateral PE for heparin Goal of Therapy:  Heparin level 0.3-0.7 units/ml Monitor platelets by anticoagulation protocol: Yes   Plan:  Heparin 5000 units IV bolus, then start heparin 1750 units/hr Check heparin level in 6 hours.   77 07/05/2020,3:00 AM

## 2020-07-05 NOTE — H&P (Signed)
History and Physical    Brandon Hawkins PYP:[id number] DOB: 27-Jan-1961 DOA: 07/04/2020  PCP: [facility] [place] (Confirm with patient/family/NH records and if not entered, this has to be entered at [facility] point of entry) Patient coming from: Home  I have personally briefly reviewed patient's old medical records in [facility]  Chief Complaint: Feeling better  HPI: Brandon Hawkins is a 59 y.o. male with medical history significant of morbid obesity, bilateral knee OA, status post right knee TKR, who underwent a left knee arthroscope 7 days ago.  Gradually over 1 week, patient started to feel shortness breath exertional, symptoms has been persistent, denied any cough or leg pains.  Since yesterday, he started to feel sharp like chest pain centrally located, persistent, no exacerbation or relieving factors.  Also shortness of breath has worsened.  No fever or chills.  Patient remember that even before the knee surgery, he occasionally felt shortness of breath and tightness in the chest but he never took it seriously.  Denies any cough, no wheezing no fever chills. ED Course: Blood pressure elevated, no significant hypoxia or tachypnea, D-dimer elevated, CT angiogram showed acute pulmonary emboli within the right greater than left pulmonary vessels. Negative for right heart strain.  Mild subpleural groundglass density on the right lung base suspect pulmonary infarct  Review of Systems: As per HPI otherwise 14 point review of systems negative.    Past Medical History:  Diagnosis Date   Arthritis    Eczema     Past Surgical History:  Procedure Laterality Date   ANAL FISTULOTOMY N/A 06/16/2019   Procedure: ANAL FISTULOTOMY;  Surgeon: Romie Levee, MD;  Location: [facility];  Service: General;  Laterality: N/A;   NO PAST SURGERIES     REPLACEMENT TOTAL KNEE Right 2020     reports that he has never smoked. He has never used smokeless tobacco. He  reports current alcohol use. He reports that he does not use drugs.  No Known Allergies  No family history on file.   Prior to Admission medications   Medication Sig Start Date End Date Taking? Authorizing Provider  oxyCODONE-acetaminophen (PERCOCET/ROXICET) 5-325 MG tablet Take by mouth every 4 (four) hours as needed for severe pain.    [provider]    Physical Exam: Vitals:   07/05/20 1745 07/05/20 1746 07/05/20 1843 07/05/20 1844  BP: (!) 147/110 (!) 147/110  (!) 147/110  Pulse: (!) 54 (!) 54  (!) 54  Resp: 20 20  20   Temp: 98.4 F (36.9 C) 98.4 F (36.9 C)  98.4 F (36.9 C)  TempSrc:    Oral  SpO2:  98%    Weight:   123.7 kg 123.7 kg  Height:   6\' 2"  (1.88 m) 6\' 2"  (1.88 m)    Constitutional: NAD, calm, comfortable Vitals:   07/05/20 1745 07/05/20 1746 07/05/20 1843 07/05/20 1844  BP: (!) 147/110 (!) 147/110  (!) 147/110  Pulse: (!) 54 (!) 54  (!) 54  Resp: 20 20  20   Temp: 98.4 F (36.9 C) 98.4 F (36.9 C)  98.4 F (36.9 C)  TempSrc:    Oral  SpO2:  98%    Weight:   123.7 kg 123.7 kg  Height:   6\' 2"  (1.88 m) 6\' 2"  (1.88 m)   Eyes: PERRL, lids and conjunctivae normal ENMT: Mucous membranes are moist. Posterior pharynx clear of any exudate or lesions.Normal dentition.  Neck: normal, supple, no masses, no thyromegaly Respiratory:  clear to auscultation bilaterally, no wheezing, no crackles. Normal respiratory effort. No accessory muscle use.  Cardiovascular: Regular rate and rhythm, no murmurs / rubs / gallops. No extremity edema. 2+ pedal pulses. No carotid bruits.  Abdomen: no tenderness, no masses palpated. No hepatosplenomegaly. Bowel sounds positive.  Musculoskeletal: no clubbing / cyanosis. No joint deformity upper and lower extremities. Good ROM, no contractures. Normal muscle tone.  Skin: no rashes, lesions, ulcers. No induration Neurologic: CN 2-12 grossly intact. Sensation intact, DTR normal. Strength 5/5 in all 4.  Psychiatric: Normal  judgment and insight. Alert and oriented x 3. Normal mood.     Labs on Admission: I have personally reviewed following labs and imaging studies  CBC: Recent Labs  Lab 07/04/20 2146  WBC 10.6*  NEUTROABS 7.4  HGB 14.4  HCT 43.0  MCV 82.2  PLT 291   Basic Metabolic Panel: Recent Labs  Lab 07/04/20 2146  NA 138  K 3.5  CL 102  CO2 24  GLUCOSE 109*  BUN 17  CREATININE 1.09  CALCIUM 9.2   GFR: Estimated Creatinine Clearance: 99.5 mL/min (by C-G formula based on SCr of 1.09 mg/dL). Liver Function Tests: Recent Labs  Lab 07/04/20 2146  AST 14*  ALT 11  ALKPHOS 83  BILITOT 0.5  PROT 7.3  ALBUMIN 4.1   No results for input(s): LIPASE, AMYLASE in the last 168 hours. No results for input(s): AMMONIA in the last 168 hours. Coagulation Profile: No results for input(s): INR, PROTIME in the last 168 hours. Cardiac Enzymes: No results for input(s): CKTOTAL, CKMB, CKMBINDEX, TROPONINI in the last 168 hours. BNP (last 3 results) No results for input(s): PROBNP in the last 8760 hours. HbA1C: No results for input(s): HGBA1C in the last 72 hours. CBG: No results for input(s): GLUCAP in the last 168 hours. Lipid Profile: No results for input(s): CHOL, HDL, LDLCALC, TRIG, CHOLHDL, LDLDIRECT in the last 72 hours. Thyroid Function Tests: No results for input(s): TSH, T4TOTAL, FREET4, T3FREE, THYROIDAB in the last 72 hours. Anemia Panel: No results for input(s): VITAMINB12, FOLATE, FERRITIN, TIBC, IRON, RETICCTPCT in the last 72 hours. Urine analysis: No results found for: COLORURINE, APPEARANCEUR, LABSPEC, PHURINE, GLUCOSEU, HGBUR, BILIRUBINUR, KETONESUR, PROTEINUR, UROBILINOGEN, NITRITE, LEUKOCYTESUR  Radiological Exams on Admission: CT Angio Chest PE W and/or Wo Contrast  Result Date: 07/04/2020 CLINICAL DATA:  Positive D-dimer short of breath recent knee surgery EXAM: CT ANGIOGRAPHY CHEST WITH CONTRAST TECHNIQUE: Multidetector CT imaging of the chest was performed using  the standard protocol during bolus administration of intravenous contrast. Multiplanar CT image reconstructions and MIPs were obtained to evaluate the vascular anatomy. CONTRAST:  OMNIPAQUE IOHEXOL 350 MG/ML SOLN COMPARISON:  Chest x-ray 03/21/2019 FINDINGS: Cardiovascular: Satisfactory opacification of the pulmonary arteries to the segmental level. Nonocclusive filling defect within the right distal descending pulmonary artery with multiple small emboli present within distal segmental and subsegmental right upper lobe, right middle lobe and right lower lobe pulmonary vessels. Small embolus within subsegmental left upper lobe vessel. No elevation of RV LV ratio. Ectatic ascending aorta measuring up to 3.9 cm. Mild aortic atherosclerosis. No dissection is seen. Cardiomegaly without pericardial effusion Mediastinum/Nodes: No enlarged mediastinal, hilar, or axillary lymph nodes. Left lobe of thyroid appears absent. No mediastinal adenopathy. Esophagus within normal limits. Lungs/Pleura: No consolidation or pleural effusion. Mild subpleural ground-glass density in the right lung base, probably atelectasis though small pulmonary infarct is possible. Upper Abdomen: No acute abnormality. Musculoskeletal: No chest wall abnormality. No acute or significant osseous findings. Review  of the MIP images confirms the above findings. IMPRESSION: 1. Positive for acute pulmonary emboli within the right greater than left pulmonary vessels. Negative for right heart strain. 2. Mild subpleural ground-glass density in the right lung base, probably atelectasis though small pulmonary infarct is also a consideration. 3. Aortic atherosclerosis. Critical Value/emergent results were called by telephone at the time of interpretation on 07/04/2020 at 11:36 pm to provider [facility] , who verbally acknowledged these results. Aortic Atherosclerosis (ICD10-I70.0). Electronically Signed   By: Jasmine Pang M.D.   On: 07/04/2020 23:36     EKG: Independently reviewed.  Chronic ST-T changes as in 2020  Assessment/Plan Active Problems:   Pulmonary emboli (HCC)   PE (pulmonary thromboembolism) (HCC)  (please populate well all problems here in Problem List. (For example, if patient is on BP meds at home and you resume or decide to hold them, it is a problem that needs to be her. Same for CAD, COPD, HLD and so on)  Acute PE -Question of whether this is provoked or unprovoked, given the minor surgery of the knee provoked is less likely, especially patient and family both concur that he had similar symptom in the room before the left knee arthroscope 7 days ago. -Continue heparin drip for tonight and then switch to Xarelto as patient family requested for insurance issue, will consult case manager -Echo and DVT study -Recommend outpatient hematology follow-up for hypercoagulable study  Accelerated blood pressure -Bradycardia related to PE, start amlodipine  Bilateral knee OA status post left knee arthroscopy -Pain control  Morbid obesity -Weight loss recommended  DVT prophylaxis: Heparin drip Code Status: Full code Family Communication: Wife at bedside Disposition Plan: Likely can go home within 24 hours after DVT and echo study Consults called: None Admission status: Telemetry admission   Emeline General MD [facility] Pager [phone]  07/05/2020, 7:09 PM

## 2020-07-06 ENCOUNTER — Observation Stay (HOSPITAL_COMMUNITY): Payer: Managed Care, Other (non HMO)

## 2020-07-06 ENCOUNTER — Observation Stay (HOSPITAL_BASED_OUTPATIENT_CLINIC_OR_DEPARTMENT_OTHER): Payer: Managed Care, Other (non HMO)

## 2020-07-06 DIAGNOSIS — I2699 Other pulmonary embolism without acute cor pulmonale: Secondary | ICD-10-CM

## 2020-07-06 DIAGNOSIS — I2694 Multiple subsegmental pulmonary emboli without acute cor pulmonale: Secondary | ICD-10-CM

## 2020-07-06 DIAGNOSIS — I351 Nonrheumatic aortic (valve) insufficiency: Secondary | ICD-10-CM

## 2020-07-06 LAB — ECHOCARDIOGRAM COMPLETE
Area-P 1/2: 2.95 cm2
Height: 74 in
S' Lateral: 3.5 cm
Weight: 4363.34 oz

## 2020-07-06 LAB — CBC
HCT: 42.9 % (ref 39.0–52.0)
Hemoglobin: 14.4 g/dL (ref 13.0–17.0)
MCH: 27.6 pg (ref 26.0–34.0)
MCHC: 33.6 g/dL (ref 30.0–36.0)
MCV: 82.2 fL (ref 80.0–100.0)
Platelets: 274 10*3/uL (ref 150–400)
RBC: 5.22 MIL/uL (ref 4.22–5.81)
RDW: 13.5 % (ref 11.5–15.5)
WBC: 9.3 10*3/uL (ref 4.0–10.5)
nRBC: 0 % (ref 0.0–0.2)

## 2020-07-06 LAB — HEPARIN LEVEL (UNFRACTIONATED): Heparin Unfractionated: 0.24 IU/mL — ABNORMAL LOW (ref 0.30–0.70)

## 2020-07-06 MED ORDER — RIVAROXABAN (XARELTO) VTE STARTER PACK (15 & 20 MG): ORAL_TABLET | ORAL | 0 refills | Status: AC

## 2020-07-06 MED ORDER — AMLODIPINE BESYLATE 5 MG PO TABS: 5.0000 mg | ORAL_TABLET | Freq: Every day | ORAL | 2 refills | Status: AC

## 2020-07-06 MED ORDER — RIVAROXABAN 15 MG PO TABS
15.0000 mg | ORAL_TABLET | Freq: Two times a day (BID) | ORAL | Status: DC
  Administered 2020-07-06: 15 mg via ORAL
  Filled 2020-07-06: qty 1

## 2020-07-06 MED ORDER — RIVAROXABAN 20 MG PO TABS: 20.0000 mg | ORAL_TABLET | Freq: Every day | ORAL | Status: DC

## 2020-07-06 NOTE — Progress Notes (Signed)
SATURATION QUALIFICATIONS: (This note is used to comply with regulatory documentation for home oxygen)  Patient Saturations on Room Air at Rest = 87%  Patient Saturations on Room Air while Ambulating = 84%  Patient Saturations on 2 Liters of oxygen while Ambulating = 92%  Please briefly explain why patient needs home oxygen:  Pt not complaining of SOB but numbers show hypoxia when walking.

## 2020-07-06 NOTE — Progress Notes (Signed)
ANTICOAGULATION CONSULT NOTE - Follow Up Consult  Pharmacy Consult for heparin Indication: pulmonary embolus  No Known Allergies  Patient Measurements: Height: 6\' 2"  (188 cm) Weight: 123.7 kg (272 lb 11.3 oz) IBW/kg (Calculated) : 82.2 Heparin Dosing Weight: 110 kg  Vital Signs: Temp: 98.3 F (36.8 C) (09/02 2243) Temp Source: Oral (09/02 2243) BP: 181/89 (09/02 2243) Pulse Rate: 62 (09/02 2243)  Labs: Recent Labs    07/04/20 2146 07/05/20 1115 07/05/20 2056 07/06/20 0506  HGB 14.4  --   --   --   HCT 43.0  --   --   --   PLT 291  --   --   --   HEPARINUNFRC  --  1.22* 0.19* 0.24*  CREATININE 1.09  --   --   --   TROPONINIHS 15  --   --   --     Estimated Creatinine Clearance: 99.5 mL/min (by C-G formula based on SCr of 1.09 mg/dL).   Assessment: 59 y.o. M on heparin for PE.   Heparin level subtherapeutic (0.24) on gtt at 1750 units/hr. No issues with line or bleeding reported per RN.  Goal of Therapy:  Heparin level 0.3-0.7 units/ml Monitor platelets by anticoagulation protocol: Yes   Plan:  Increase heparin to 1950 units/hr F/u 6 hr heparin level  77, PharmD, BCPS Please see amion for complete clinical pharmacist phone list 07/06/2020 6:39 AM

## 2020-07-06 NOTE — Discharge Summary (Signed)
Physician Discharge Summary  Brandon Hawkins LKG:[id number] DOB: [birth year] DOA: 07/04/2020  PCP: [facility] [place]  Admit date: 07/04/2020 Discharge date: 07/06/2020  Time spent: 50 minutes  Recommendations for Outpatient Follow-up:  1. Follow-up PCP in 2 weeks  Discharge Diagnoses:  Active Problems:   Pulmonary emboli (HCC)   PE (pulmonary thromboembolism) (HCC)   Discharge Condition: Stable  Diet recommendation: Heart healthy diet  Filed Weights   07/04/20 2140 07/05/20 1843 07/05/20 1844  Weight: 122.5 kg 123.7 kg 123.7 kg    History of present illness:  59 year old male with history of morbid obesity, bilateral osteoarthritis, s/p right knee TKR who underwent left knee arthroscopy 7 days ago.  For past 1 week patient started having shortness of breath on exertion.  Also started having chest pain yesterday.  He came to the ED.  D-dimer was elevated 1.81.  CT chest showed acute pulmonary emboli.  Negative for right heart strain.  Hospital Course:  Pulmonary embolism patient was started on IV heparin, echocardiogram obtained shows no right heart strain.  Venous duplex of lower extremities negative for DVT.?  Provoked versus unprovoked, patient had minor surgery of left knee a week ago.  Patient is not requiring oxygen.  Denies shortness of breath.  Will discharge home on Xarelto starter pack.  Patient will need to take Xarelto at least for 3 to 6 months.  And follow-up with PCP for further recommendations.  Hypertension-blood pressure was elevated, patient started amlodipine 5 mg daily.  Bilateral knee osteoarthritis, s/p left knee arthroscopy, continue Vicodin as needed.  Procedures:    Consultations:    Discharge Exam: Vitals:   07/05/20 2243 07/06/20 0801  BP: (!) 181/89 (!) 147/81  Pulse: 62 60  Resp: 18 20  Temp: 98.3 F (36.8 C) 97.6 F (36.4 C)  SpO2: 99% 99%    General: Appears in no acute distress Cardiovascular: S1-S2,  regular Respiratory: Clear to auscultation bilaterally  Discharge Instructions   Discharge Instructions    Diet - low sodium heart healthy   Complete by: As directed    Increase activity slowly   Complete by: As directed    No wound care   Complete by: As directed      Allergies as of 07/06/2020   No Known Allergies     Medication List    TAKE these medications   amLODipine 5 MG tablet Commonly known as: NORVASC Take 1 tablet (5 mg total) by mouth daily. Start taking on: July 07, 2020   docusate sodium 100 MG capsule Commonly known as: COLACE Take 100 mg by mouth 2 (two) times daily as needed for constipation.   HYDROcodone-acetaminophen 7.5-325 MG tablet Commonly known as: NORCO Take 1 tablet by mouth 4 (four) times daily as needed for pain.   polyvinyl alcohol 1.4 % ophthalmic solution Commonly known as: LIQUIFILM TEARS Place 1 drop into both eyes as needed for dry eyes.   Rivaroxaban Stater Pack (15 mg and 20 mg) Commonly known as: XARELTO STARTER PACK Follow package directions: Take one 15mg  tablet by mouth twice a day. On day 22, switch to one 20mg  tablet once a day. Take with food.      No Known Allergies  Follow-up Information    [facility] [place] Follow up in 2 week(s).   Specialty: Family Medicine Contact information: [address] [place] [address] [phone]                The  results of significant diagnostics from this hospitalization (including imaging, microbiology, ancillary and laboratory) are listed below for reference.    Significant Diagnostic Studies: CT Angio Chest PE W and/or Wo Contrast  Result Date: 07/04/2020 CLINICAL DATA:  Positive D-dimer short of breath recent knee surgery EXAM: CT ANGIOGRAPHY CHEST WITH CONTRAST TECHNIQUE: Multidetector CT imaging of the chest was performed using the standard protocol during bolus administration of intravenous contrast. Multiplanar CT image  reconstructions and MIPs were obtained to evaluate the vascular anatomy. CONTRAST:  OMNIPAQUE IOHEXOL 350 MG/ML SOLN COMPARISON:  Chest x-ray 03/21/2019 FINDINGS: Cardiovascular: Satisfactory opacification of the pulmonary arteries to the segmental level. Nonocclusive filling defect within the right distal descending pulmonary artery with multiple small emboli present within distal segmental and subsegmental right upper lobe, right middle lobe and right lower lobe pulmonary vessels. Small embolus within subsegmental left upper lobe vessel. No elevation of RV LV ratio. Ectatic ascending aorta measuring up to 3.9 cm. Mild aortic atherosclerosis. No dissection is seen. Cardiomegaly without pericardial effusion Mediastinum/Nodes: No enlarged mediastinal, hilar, or axillary lymph nodes. Left lobe of thyroid appears absent. No mediastinal adenopathy. Esophagus within normal limits. Lungs/Pleura: No consolidation or pleural effusion. Mild subpleural ground-glass density in the right lung base, probably atelectasis though small pulmonary infarct is possible. Upper Abdomen: No acute abnormality. Musculoskeletal: No chest wall abnormality. No acute or significant osseous findings. Review of the MIP images confirms the above findings. IMPRESSION: 1. Positive for acute pulmonary emboli within the right greater than left pulmonary vessels. Negative for right heart strain. 2. Mild subpleural ground-glass density in the right lung base, probably atelectasis though small pulmonary infarct is also a consideration. 3. Aortic atherosclerosis. Critical Value/emergent results were called by telephone at the time of interpretation on 07/04/2020 at 11:36 pm to provider [facility] , who verbally acknowledged these results. Aortic Atherosclerosis (ICD10-I70.0). Electronically Signed   By: Jasmine Pang M.D.   On: 07/04/2020 23:36   ECHOCARDIOGRAM COMPLETE  Result Date: 07/06/2020    ECHOCARDIOGRAM REPORT   Patient Name:   Brandon Hawkins  [facility] Date of Exam: 07/06/2020 Medical Rec #:  [id number]       Height:       74.0 in Accession #:    [id number]      Weight:       272.7 lb Date of Birth:  07-03-1961       BSA:          2.480 m Patient Age:    59 years        BP:           181/89 mmHg Patient Gender: M               HR:           52 bpm. Exam Location:  Inpatient Procedure: 2D Echo, Cardiac Doppler and Color Doppler Indications:    Pulmonary Embolus 415.19 / I26.99  History:        Patient has no prior history of Echocardiogram examinations.  Sonographer:    Eulah Pont RDCS Referring Phys: [id number] Emeline General IMPRESSIONS  1. Left ventricular ejection fraction, by estimation, is 60 to 65%. The left ventricle has normal function. The left ventricle has no regional wall motion abnormalities. Left ventricular diastolic parameters are consistent with Grade I diastolic dysfunction (impaired relaxation).  2. Right ventricular systolic function is normal. The right ventricular size is normal.  3. The mitral valve is normal in structure. No evidence of mitral  valve regurgitation. No evidence of mitral stenosis.  4. The aortic valve is normal in structure. Aortic valve regurgitation is mild. No aortic stenosis is present.  5. Aortic dilatation noted. There is mild dilatation of the ascending aorta measuring 40 mm.  6. The inferior vena cava is normal in size with greater than 50% respiratory variability, suggesting right atrial pressure of 3 mmHg. FINDINGS  Left Ventricle: Left ventricular ejection fraction, by estimation, is 60 to 65%. The left ventricle has normal function. The left ventricle has no regional wall motion abnormalities. The left ventricular internal cavity size was normal in size. There is  no left ventricular hypertrophy. Left ventricular diastolic parameters are consistent with Grade I diastolic dysfunction (impaired relaxation). Normal left ventricular filling pressure. Right Ventricle: The right ventricular size is normal. No  increase in right ventricular wall thickness. Right ventricular systolic function is normal. Left Atrium: Left atrial size was normal in size. Right Atrium: Right atrial size was normal in size. Pericardium: There is no evidence of pericardial effusion. Mitral Valve: The mitral valve is normal in structure. Normal mobility of the mitral valve leaflets. No evidence of mitral valve regurgitation. No evidence of mitral valve stenosis. Tricuspid Valve: The tricuspid valve is normal in structure. Tricuspid valve regurgitation is not demonstrated. No evidence of tricuspid stenosis. Aortic Valve: The aortic valve is normal in structure. Aortic valve regurgitation is mild. No aortic stenosis is present. Pulmonic Valve: The pulmonic valve was normal in structure. Pulmonic valve regurgitation is not visualized. No evidence of pulmonic stenosis. Aorta: Aortic dilatation noted. There is mild dilatation of the ascending aorta measuring 40 mm. Venous: The inferior vena cava is normal in size with greater than 50% respiratory variability, suggesting right atrial pressure of 3 mmHg. IAS/Shunts: No atrial level shunt detected by color flow Doppler.  LEFT VENTRICLE PLAX 2D LVIDd:         5.00 cm  Diastology LVIDs:         3.50 cm  LV e' lateral:   8.15 cm/s LV PW:         1.00 cm  LV E/e' lateral: 7.3 LV IVS:        1.30 cm  LV e' medial:    3.94 cm/s LVOT diam:     2.30 cm  LV E/e' medial:  15.0 LV SV:         78 LV SV Index:   32 LVOT Area:     4.15 cm  RIGHT VENTRICLE RV S prime:     13.50 cm/s TAPSE (M-mode): 2.1 cm LEFT ATRIUM             Index       RIGHT ATRIUM           Index LA diam:        3.60 cm 1.45 cm/m  RA Area:     18.70 cm LA Vol (A2C):   51.4 ml 20.73 ml/m RA Volume:   44.60 ml  17.99 ml/m LA Vol (A4C):   71.2 ml 28.71 ml/m LA Biplane Vol: 61.4 ml 24.76 ml/m  AORTIC VALVE LVOT Vmax:   79.60 cm/s LVOT Vmean:  54.800 cm/s LVOT VTI:    0.188 m  AORTA Ao Root diam: 3.90 cm Ao Asc diam:  4.00 cm MITRAL VALVE MV  Area (PHT): 2.95 cm    SHUNTS MV Decel Time: 257 msec    Systemic VTI:  0.19 m MV E velocity: 59.10 cm/s  Systemic Diam: 2.30 cm MV  A velocity: 63.80 cm/s MV E/A ratio:  0.93 Mihai Croitoru MD Electronically signed by Thurmon Fair MD Signature Date/Time: 07/06/2020/1:39:46 PM    Final    VAS [place] LOWER EXTREMITY VENOUS (DVT)  Result Date: 07/06/2020  Lower Venous DVTStudy Indications: Pulmonary embolism.  Comparison Study: no prior Performing Technologist: Blanch Media RVS  Examination Guidelines: A complete evaluation includes B-mode imaging, spectral Doppler, color Doppler, and power Doppler as needed of all accessible portions of each vessel. Bilateral testing is considered an integral part of a complete examination. Limited examinations for reoccurring indications may be performed as noted. The reflux portion of the exam is performed with the patient in reverse Trendelenburg.  +---------+---------------+---------+-----------+----------+--------------+ RIGHT    CompressibilityPhasicitySpontaneityPropertiesThrombus Aging +---------+---------------+---------+-----------+----------+--------------+ CFV      Full           Yes      Yes                                 +---------+---------------+---------+-----------+----------+--------------+ SFJ      Full                                                        +---------+---------------+---------+-----------+----------+--------------+ FV Prox  Full                                                        +---------+---------------+---------+-----------+----------+--------------+ FV Mid   Full                                                        +---------+---------------+---------+-----------+----------+--------------+ FV DistalFull                                                        +---------+---------------+---------+-----------+----------+--------------+ PFV      Full                                                         +---------+---------------+---------+-----------+----------+--------------+ POP      Full           Yes      Yes                                 +---------+---------------+---------+-----------+----------+--------------+ PTV      Full                                                        +---------+---------------+---------+-----------+----------+--------------+  PERO     Full                                                        +---------+---------------+---------+-----------+----------+--------------+   +---------+---------------+---------+-----------+----------+--------------+ LEFT     CompressibilityPhasicitySpontaneityPropertiesThrombus Aging +---------+---------------+---------+-----------+----------+--------------+ CFV      Full           Yes      Yes                                 +---------+---------------+---------+-----------+----------+--------------+ SFJ      Full                                                        +---------+---------------+---------+-----------+----------+--------------+ FV Prox  Full                                                        +---------+---------------+---------+-----------+----------+--------------+ FV Mid   Full                                                        +---------+---------------+---------+-----------+----------+--------------+ FV DistalFull                                                        +---------+---------------+---------+-----------+----------+--------------+ PFV      Full                                                        +---------+---------------+---------+-----------+----------+--------------+ POP      Full           Yes      Yes                                 +---------+---------------+---------+-----------+----------+--------------+ PTV      Full                                                         +---------+---------------+---------+-----------+----------+--------------+ PERO     Full                                                        +---------+---------------+---------+-----------+----------+--------------+  Summary: BILATERAL: - No evidence of deep vein thrombosis seen in the lower extremities, bilaterally. - No evidence of superficial venous thrombosis in the lower extremities, bilaterally. -   *See table(s) above for measurements and observations.    Preliminary     Microbiology: Recent Results (from the past 240 hour(s))  SARS Coronavirus 2 by RT PCR (hospital order, performed in [facility] hospital lab) Nasopharyngeal Nasopharyngeal Swab     Status: None   Collection Time: 07/05/20  1:27 AM   Specimen: Nasopharyngeal Swab  Result Value Ref Range Status   SARS Coronavirus 2 NEGATIVE NEGATIVE Final    Comment: (NOTE) SARS-CoV-2 target nucleic acids are NOT DETECTED.  The SARS-CoV-2 RNA is generally detectable in upper and lower respiratory specimens during the acute phase of infection. The lowest concentration of SARS-CoV-2 viral copies this assay can detect is 250 copies / mL. A negative result does not preclude SARS-CoV-2 infection and should not be used as the sole basis for treatment or other patient management decisions.  A negative result may occur with improper specimen collection / handling, submission of specimen other than nasopharyngeal swab, presence of viral mutation(s) within the areas targeted by this assay, and inadequate number of viral copies (<250 copies / mL). A negative result must be combined with clinical observations, patient history, and epidemiological information.  Fact Sheet for Patients:   BoilerBrush.com.cy  Fact Sheet for Healthcare Providers: https://pope.com/  This test is not yet approved or  cleared by the [place] FDA and has been authorized for detection and/or  diagnosis of SARS-CoV-2 by FDA under an Emergency Use Authorization (EUA).  This EUA will remain in effect (meaning this test can be used) for the duration of the COVID-19 declaration under Section 564(b)(1) of the Act, 21 U.S.C. section 360bbb-3(b)(1), unless the authorization is terminated or revoked sooner.  Performed at [facility], [address] [place], [place] [address]      Labs: Basic Metabolic Panel: Recent Labs  Lab 07/04/20 2146  NA 138  K 3.5  CL 102  CO2 24  GLUCOSE 109*  BUN 17  CREATININE 1.09  CALCIUM 9.2   Liver Function Tests: Recent Labs  Lab 07/04/20 2146  AST 14*  ALT 11  ALKPHOS 83  BILITOT 0.5  PROT 7.3  ALBUMIN 4.1   No results for input(s): LIPASE, AMYLASE in the last 168 hours. No results for input(s): AMMONIA in the last 168 hours. CBC: Recent Labs  Lab 07/04/20 2146 07/06/20 0506  WBC 10.6* 9.3  NEUTROABS 7.4  --   HGB 14.4 14.4  HCT 43.0 42.9  MCV 82.2 82.2  PLT 291 274       Signed:  Meredeth Ide MD.  [facility] 07/06/2020, 3:11 PM

## 2020-07-06 NOTE — Progress Notes (Signed)
CSW received consult for medication assistance.  CSW spoke with pt and wife Annabelle Harman, who report that they know they can afford xarelto, but the MD had mentioned eliquis and they also know they cannot afford eliquis.  CSW confirmed with MD that pt will be staying on xarelto, informed the family of this.  No other needs.  Garner Nash, MSW, LCSW 07/06/2020 2:54 PM

## 2020-07-06 NOTE — Progress Notes (Signed)
Lower extremity venous has been completed.   Preliminary results in CV Proc.   Blanch Media 07/06/2020 2:43 PM

## 2020-07-06 NOTE — Discharge Instructions (Signed)
Continue taking xarelto for 6 months. Follow your PCP for further recommendations

## 2020-07-06 NOTE — Plan of Care (Signed)
  Problem: Education: Goal: Knowledge of General Education information will improve Description: Including pain rating scale, medication(s)/side effects and non-pharmacologic comfort measures Outcome: Progressing   Problem: Health Behavior/Discharge Planning: Goal: Ability to manage health-related needs will improve Outcome: Progressing   Problem: Clinical Measurements: Goal: Ability to maintain clinical measurements within normal limits will improve Outcome: Progressing Goal: Will remain free from infection Outcome: Progressing Goal: Diagnostic test results will improve Outcome: Progressing Goal: Respiratory complications will improve Outcome: Progressing Goal: Cardiovascular complication will be avoided Outcome: Progressing   Problem: Activity: Goal: Risk for activity intolerance will decrease Outcome: Progressing   Problem: Nutrition: Goal: Adequate nutrition will be maintained Outcome: Progressing   Problem: Coping: Goal: Level of anxiety will decrease Outcome: Progressing   Problem: Elimination: Goal: Will not experience complications related to bowel motility Outcome: Progressing Goal: Will not experience complications related to urinary retention Outcome: Progressing   Problem: Pain Managment: Goal: General experience of comfort will improve Outcome: Progressing   Problem: Safety: Goal: Ability to remain free from injury will improve Outcome: Progressing   Problem: Skin Integrity: Goal: Risk for impaired skin integrity will decrease Outcome: Progressing   Problem: Consults Goal: Diagnosis - Venous Thromboembolism (VTE) Description: Choose a selection Variance No documentation Goal: Skin Care Protocol Initiated - if Braden Score 18 or less Description: If consults are not indicated, leave blank or document N/A Outcome: Progressing Goal: Nutrition Consult-if indicated Outcome: Progressing Goal: Diabetes Guidelines if Diabetic/Glucose >  140 Description: If diabetic or lab glucose is > 140 mg/dl - Initiate Diabetes/Hyperglycemia Guidelines & Document Interventions  Outcome: Progressing   Problem: Phase I Progression Outcomes Goal: Pain controlled with appropriate interventions Outcome: Progressing Goal: Dyspnea controlled at rest (PE) Outcome: Progressing Goal: Tolerating diet Outcome: Progressing Goal: Initial discharge plan identified Outcome: Progressing Goal: Voiding-avoid urinary catheter unless indicated Outcome: Progressing Goal: Hemodynamically stable Outcome: Progressing Goal: Other Phase I Outcomes/Goals Outcome: Progressing   Problem: Phase II Progression Outcomes Goal: Therapeutic drug levels for anticoagulation Outcome: Progressing Goal: 02 sats trending upward/stable (PE) Outcome: Progressing Goal: Discharge plan established Outcome: Progressing Goal: Tolerating diet Outcome: Progressing Goal: Other Phase II Outcomes/Goals Outcome: Progressing

## 2020-07-06 NOTE — Progress Notes (Signed)
   07/06/20 1033  Clinical Encounter Type  Visited With Patient  Visit Type Initial  Referral From Nurse  Consult/Referral To Chaplain  Chaplain called patient. Advised responding to request for prayer. Patient advised he is Jehovah Witness, declined spiritual care. Chaplain informed patient, spiritual care is available around the clock and RN can page if needed. This note was prepared by Deneen Harts.  For questions please contact by phone [phone]

## 2020-07-06 NOTE — TOC Benefit Eligibility Note (Signed)
Transition of Care [facility]) Benefit Eligibility Note    Patient Details  Name: Brandon Hawkins MRN: [id number] Date of Birth: [birth year]   Medication/Dose: Alveda Reasons  15 MG BID   CO-PAY-$25.00  and   XARELTO  20 MG DAILY CO-PAY- $25.00  Covered?: Yes  Tier:  (HRCBULAGT)  Prescription Coverage Preferred Pharmacy: [facility] [place] with Person/Company/Phone Number:: [facility]    @ [employer] # [phone]  Co-Pay: $ 25.   FOR EACH PRESCRIPTION  Prior Approval: No  Deductible: Met  Additional Notes: ELIQUIS 2.5 MG BID CO-PAY- $25.00   and   ELIQUIS 5 MG BID CO-PAY- $25.00    Memory Argue Phone Number: 07/06/2020, 3:40 PM

## 2020-09-15 ENCOUNTER — Other Ambulatory Visit: Payer: Self-pay

## 2020-09-15 ENCOUNTER — Ambulatory Visit: Payer: Managed Care, Other (non HMO) | Attending: Internal Medicine

## 2020-09-15 DIAGNOSIS — Z23 Encounter for immunization: Secondary | ICD-10-CM

## 2020-09-15 NOTE — Progress Notes (Signed)
   Covid-19 Vaccination Clinic  Name:  Brandon Hawkins    MRN: [id number] DOB: September 06, 1961  09/15/2020  [name] was observed post Covid-19 immunization for 15 minutes without incident. He was provided with Vaccine Information Sheet and instruction to access the V-Safe system.   [name] was instructed to call 911 with any severe reactions post vaccine: Marland Kitchen Difficulty breathing  . Swelling of face and throat  . A fast heartbeat  . A bad rash all over body  . Dizziness and weakness   Immunizations Administered    Name Date Dose VIS Date Route   Pfizer COVID-19 Vaccine 09/15/2020  2:46 PM 0.3 mL 08/22/2020 Intramuscular   Manufacturer: [employer], [employer]   Lot: [id number]   NDC: [address]-2

## 2020-12-14 IMAGING — DX CHEST - 2 VIEW
2 series · 2 of 2 positions shown · non-contrast
Comparison: March 30, 2011

CLINICAL DATA: Chest pain and shortness of breath

EXAM:
CHEST - 2 VIEW

[chest pa]
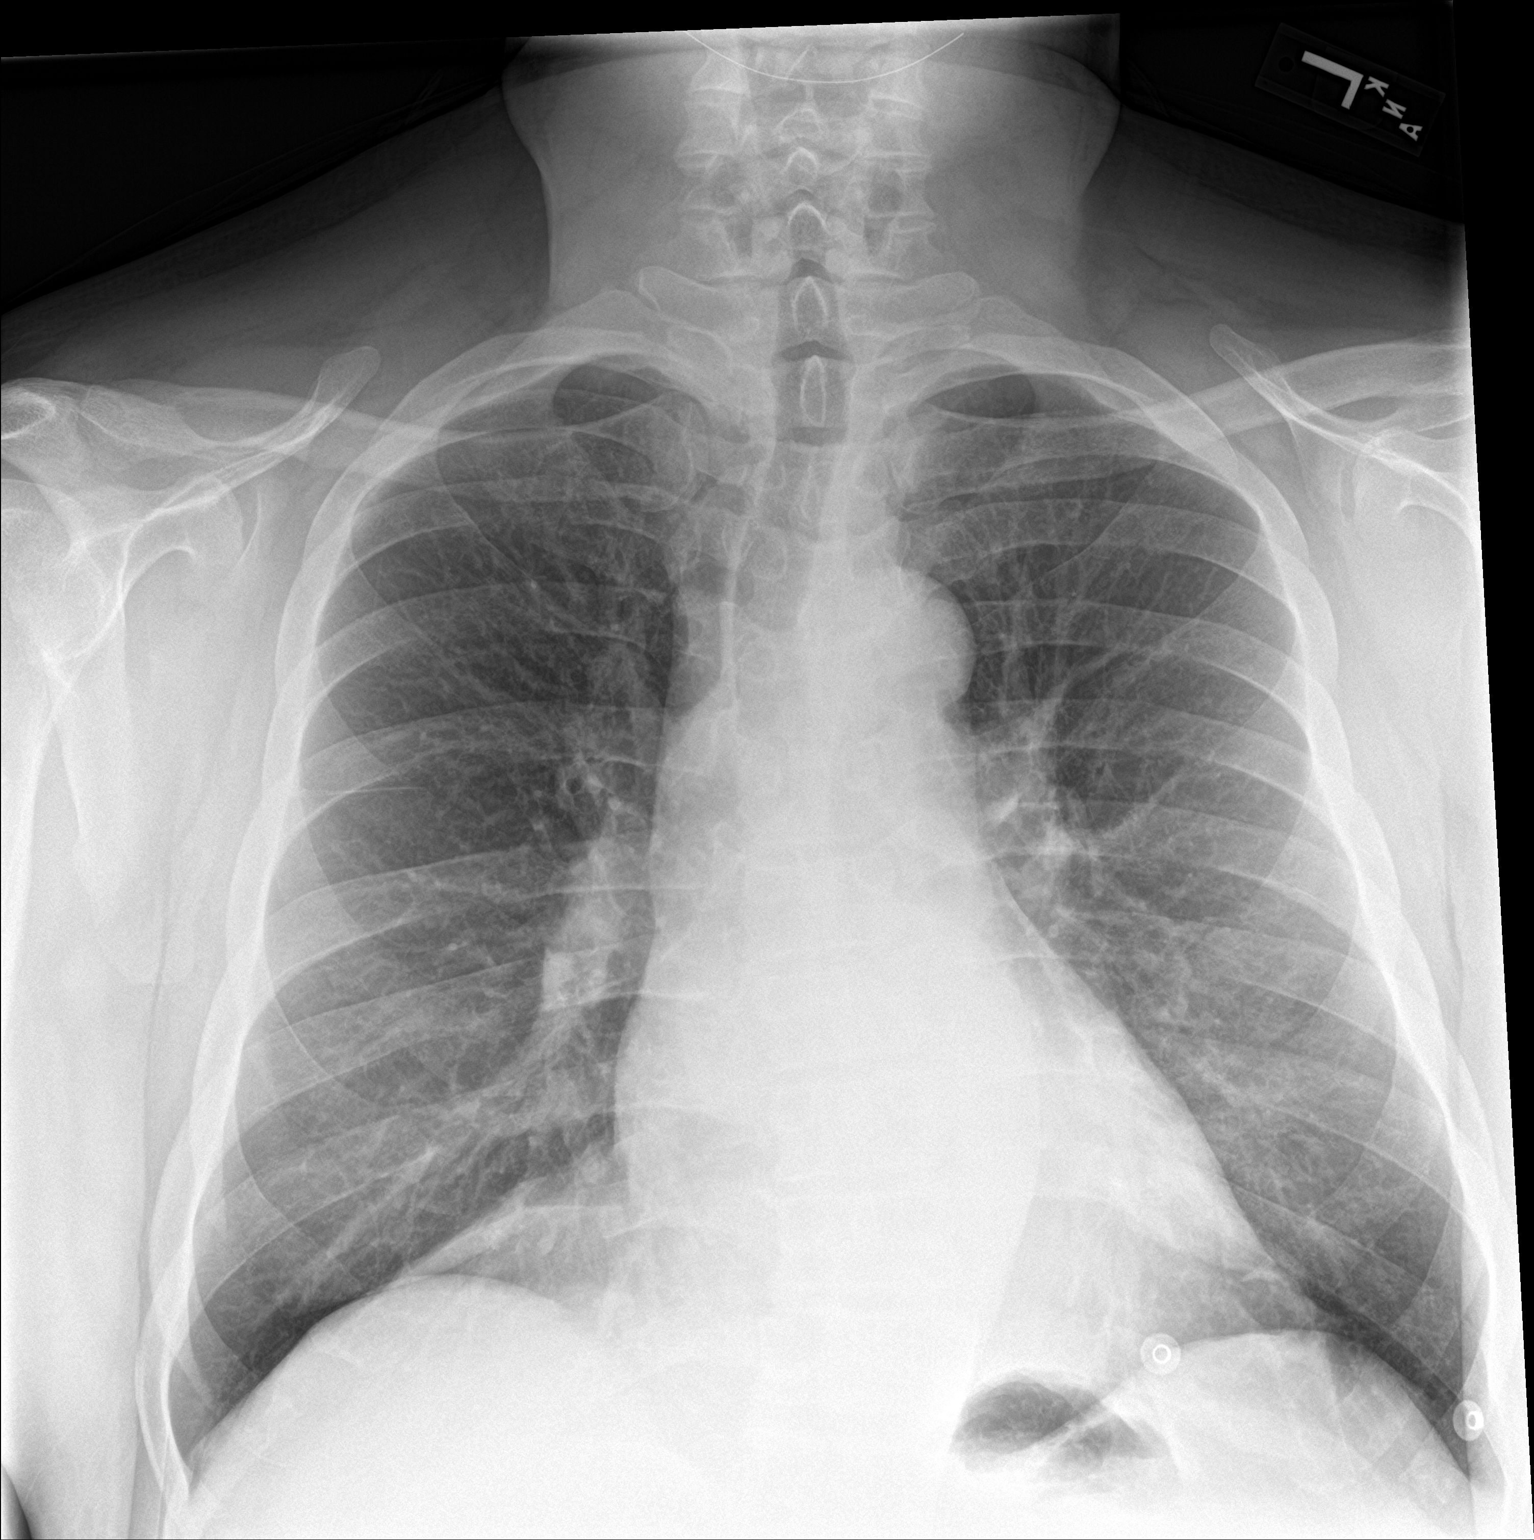

[chest lat]
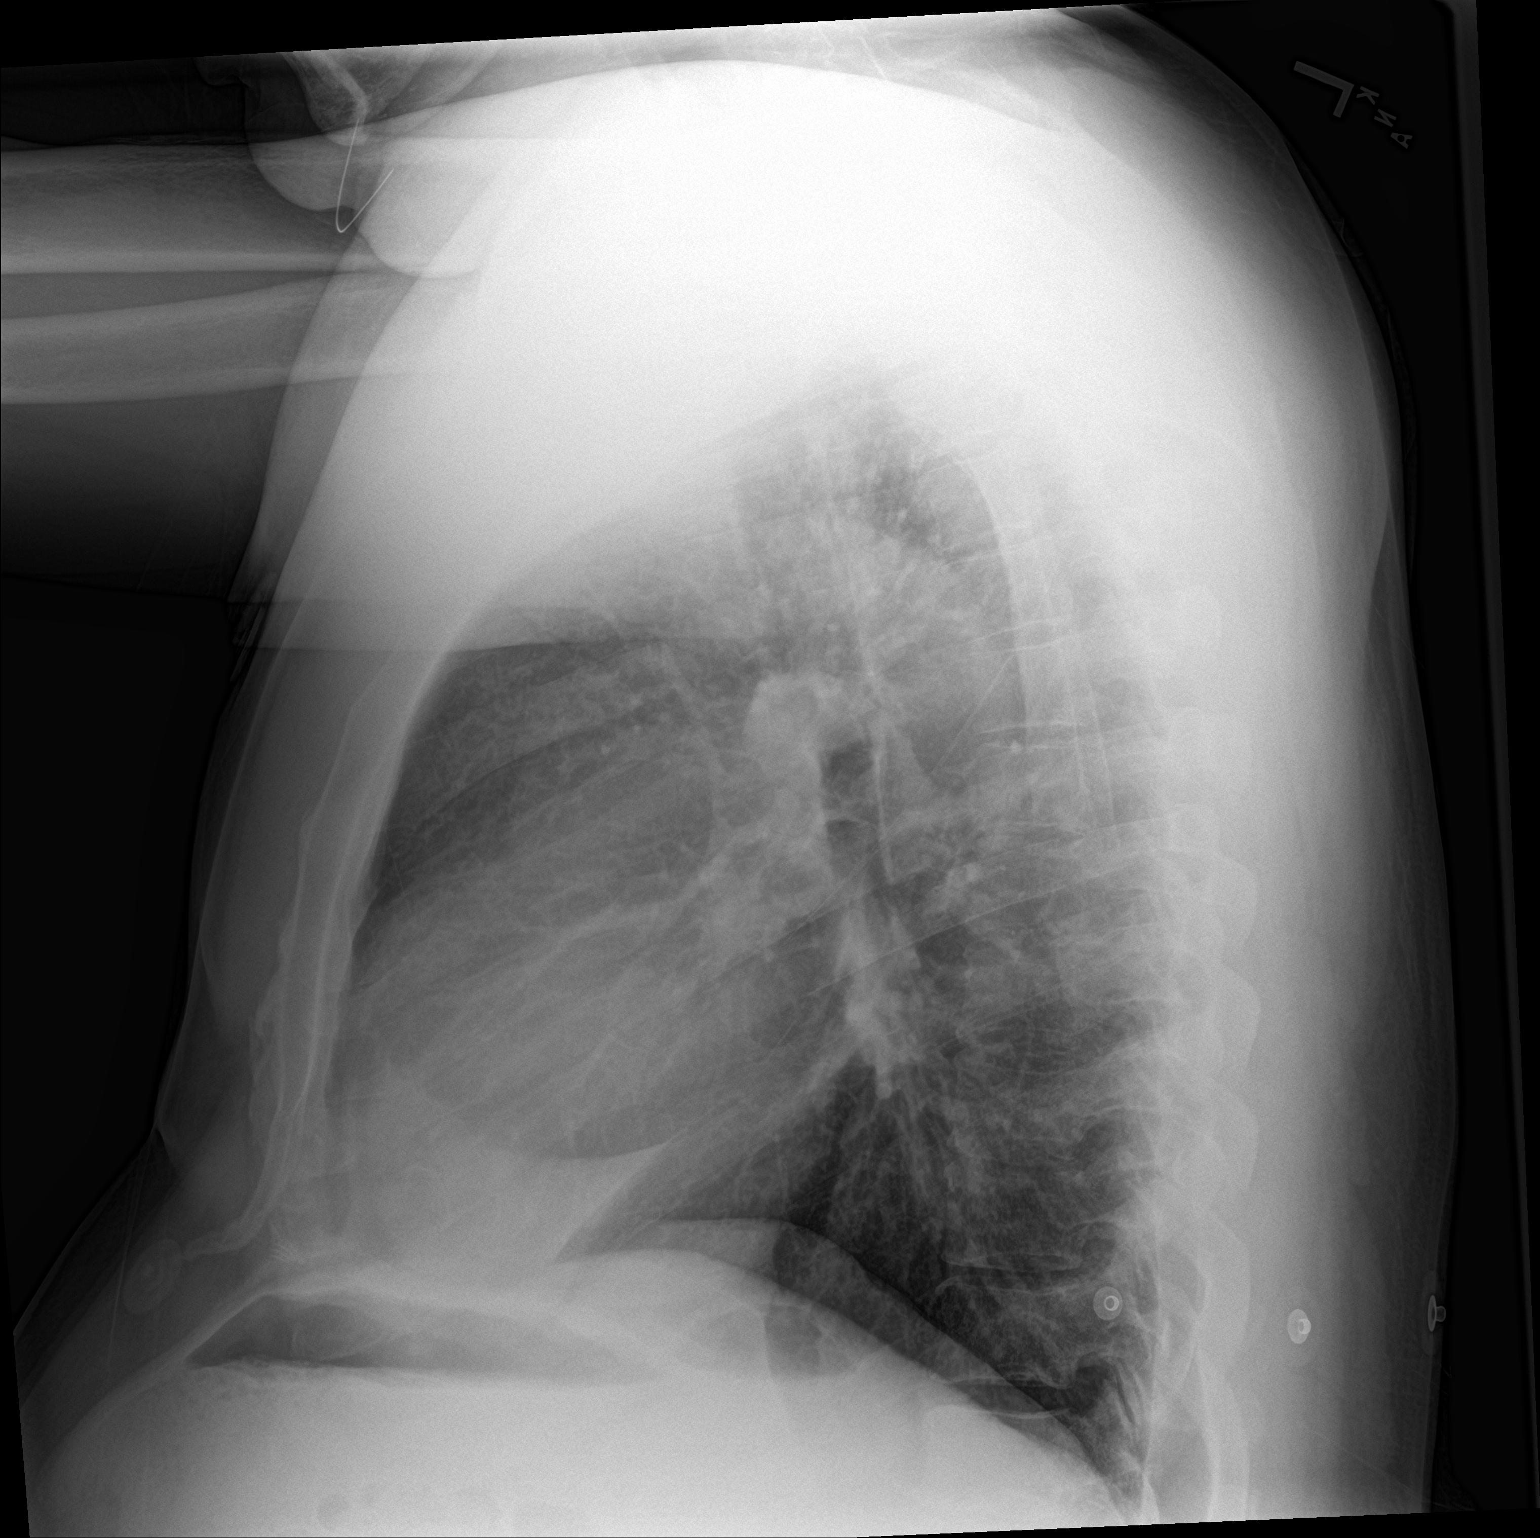

[2 of 2 positions shown; findings below may reference images not displayed]

FINDINGS: No edema or consolidation. Heart size and pulmonary vascularity are
normal. No adenopathy. No pneumothorax. No bone lesions.
IMPRESSION: No edema or consolidation.

## 2021-05-29 ENCOUNTER — Ambulatory Visit (INDEPENDENT_AMBULATORY_CARE_PROVIDER_SITE_OTHER): Payer: 59

## 2021-05-29 ENCOUNTER — Other Ambulatory Visit: Payer: Self-pay

## 2021-05-29 ENCOUNTER — Ambulatory Visit (INDEPENDENT_AMBULATORY_CARE_PROVIDER_SITE_OTHER): Payer: 59 | Admitting: Podiatry

## 2021-05-29 DIAGNOSIS — M19071 Primary osteoarthritis, right ankle and foot: Secondary | ICD-10-CM | POA: Diagnosis not present

## 2021-05-29 DIAGNOSIS — M778 Other enthesopathies, not elsewhere classified: Secondary | ICD-10-CM

## 2021-05-29 DIAGNOSIS — M2142 Flat foot [pes planus] (acquired), left foot: Secondary | ICD-10-CM

## 2021-05-29 DIAGNOSIS — M2141 Flat foot [pes planus] (acquired), right foot: Secondary | ICD-10-CM

## 2021-05-29 MED ORDER — BETAMETHASONE SOD PHOS & ACET 6 (3-3) MG/ML IJ SUSP
3.0000 mg | Freq: Once | INTRAMUSCULAR | Status: AC
  Administered 2021-05-29: 3 mg via INTRA_ARTICULAR

## 2021-05-29 MED ORDER — METHYLPREDNISOLONE 4 MG PO TBPK: ORAL_TABLET | ORAL | 0 refills | Status: AC

## 2021-05-29 NOTE — Patient Instructions (Signed)
Custom molded orthotics.

## 2021-05-29 NOTE — Progress Notes (Signed)
   HPI: 60 y.o. male presenting today as a new patient for evaluation of right foot pain is been going on for about 2 years now.  Patient does recall an incident in the past where he dropped an office desk on his right foot.  He has been experiencing pain and tenderness ever since.  He also underwent knee replacement surgery a few years ago to the right lower extremity.  Patient works on his feet all day and has constant pain.  He presents for further treatment and evaluation  Past Medical History:  Diagnosis Date   Arthritis    Eczema      Physical Exam: General: The patient is alert and oriented x3 in no acute distress.  Dermatology: Skin is warm, dry and supple bilateral lower extremities. Negative for open lesions or macerations.  Vascular: Palpable pedal pulses bilaterally. No edema or erythema noted. Capillary refill within normal limits.  Neurological: Epicritic and protective threshold grossly intact bilaterally.   Musculoskeletal Exam: Range of motion within normal limits to all pedal and ankle joints bilateral. Muscle strength 5/5 in all groups bilateral.  Pain on palpation throughout the midtarsal joint of the right foot  Radiographic Exam:  Normal osseous mineralization.  There does appear to be some degenerative changes to the midtarsal joint of the right foot consistent with the degenerative joint disease and osteoarthritis no fracture/dislocation/boney destruction.    Assessment: 1.  DJD/capsulitis right foot   Plan of Care:  1. Patient evaluated. X-Rays reviewed.  2.  Injection of 0.5 cc Celestone Soluspan injected into the midtarsal joint right foot 3.  Prescription for Medrol Dosepak 4.  Patient cannot take NSAIDs due to anticoagulant medication therapy 5.  Today the patient was molded for custom molded orthotics 6.  Return to clinic in 4 weeks for orthotics dispensal      Felecia Shelling, DPM [facility]  Dr. Felecia Shelling, DPM    [address] [place], [place] [address]                Office [phone]  Fax [phone]

## 2021-06-04 ENCOUNTER — Other Ambulatory Visit: Payer: Self-pay | Admitting: Family Medicine

## 2021-06-04 DIAGNOSIS — Z86711 Personal history of pulmonary embolism: Secondary | ICD-10-CM

## 2021-06-20 ENCOUNTER — Telehealth: Payer: Self-pay | Admitting: Podiatry

## 2021-06-20 NOTE — Telephone Encounter (Signed)
Orthotics in.. lvm for pt to call to schedule an appt to pick them up. °

## 2021-06-24 ENCOUNTER — Ambulatory Visit (INDEPENDENT_AMBULATORY_CARE_PROVIDER_SITE_OTHER): Payer: 59 | Admitting: *Deleted

## 2021-06-24 ENCOUNTER — Other Ambulatory Visit: Payer: Self-pay

## 2021-06-24 DIAGNOSIS — M2142 Flat foot [pes planus] (acquired), left foot: Secondary | ICD-10-CM

## 2021-06-24 DIAGNOSIS — M2141 Flat foot [pes planus] (acquired), right foot: Secondary | ICD-10-CM

## 2021-06-24 NOTE — Progress Notes (Signed)
Patient presents today to pick up custom molded foot orthotics, diagnosed with pes planus by Dr. Logan Bores.   Orthotics were dispensed and fit was satisfactory. Reviewed instructions for break-in and wear. Written instructions given to patient.  Patient will follow up as needed.   [facility] - order # [id number]

## 2021-07-10 ENCOUNTER — Ambulatory Visit
Admission: RE | Admit: 2021-07-10 | Discharge: 2021-07-10 | Disposition: A | Discharge status: Home / Self Care | Payer: 59 | Source: Ambulatory Visit | Attending: Family Medicine | Admitting: Family Medicine

## 2021-07-10 ENCOUNTER — Other Ambulatory Visit: Payer: Self-pay

## 2021-07-10 DIAGNOSIS — Z86711 Personal history of pulmonary embolism: Secondary | ICD-10-CM

## 2021-07-10 MED ORDER — IOPAMIDOL (ISOVUE-370) INJECTION 76%
75.0000 mL | Freq: Once | INTRAVENOUS | Status: AC | PRN
  Administered 2021-07-10: 75 mL via INTRAVENOUS
